# Patient Record
Sex: Female | Born: 1975
Health system: Southern US, Community
[De-identification: ages and names within clinical notes are randomized; demographics above are authoritative.]

## PROBLEM LIST (undated history)

## (undated) ENCOUNTER — Emergency Department (HOSPITAL_BASED_OUTPATIENT_CLINIC_OR_DEPARTMENT_OTHER): Admission: EM | Discharge status: Absent without leave | Payer: Medicaid Other

## (undated) ENCOUNTER — Emergency Department (HOSPITAL_BASED_OUTPATIENT_CLINIC_OR_DEPARTMENT_OTHER): Payer: Medicaid Other | Source: Home / Self Care

## (undated) DIAGNOSIS — L732 Hidradenitis suppurativa: Secondary | ICD-10-CM

## (undated) HISTORY — PX: OTHER SURGICAL HISTORY: SHX169

---

## 2009-10-13 ENCOUNTER — Emergency Department (HOSPITAL_COMMUNITY): Admission: EM | Admit: 2009-10-13 | Discharge: 2009-10-13 | Payer: Self-pay | Admitting: Family Medicine

## 2011-02-20 ENCOUNTER — Emergency Department (HOSPITAL_BASED_OUTPATIENT_CLINIC_OR_DEPARTMENT_OTHER)
Admission: EM | Admit: 2011-02-20 | Discharge: 2011-02-20 | Disposition: A | Discharge status: Home / Self Care | Payer: Medicaid Other | Attending: Emergency Medicine | Admitting: Emergency Medicine

## 2011-02-20 DIAGNOSIS — N760 Acute vaginitis: Secondary | ICD-10-CM | POA: Insufficient documentation

## 2011-02-20 DIAGNOSIS — B9689 Other specified bacterial agents as the cause of diseases classified elsewhere: Secondary | ICD-10-CM | POA: Insufficient documentation

## 2011-02-20 DIAGNOSIS — R51 Headache: Secondary | ICD-10-CM | POA: Insufficient documentation

## 2011-02-20 DIAGNOSIS — H9209 Otalgia, unspecified ear: Secondary | ICD-10-CM | POA: Insufficient documentation

## 2011-02-20 DIAGNOSIS — A499 Bacterial infection, unspecified: Secondary | ICD-10-CM | POA: Insufficient documentation

## 2011-02-20 LAB — URINALYSIS, ROUTINE W REFLEX MICROSCOPIC
Glucose, UA: NEGATIVE mg/dL
Ketones, ur: 15 mg/dL — AB
Leukocytes, UA: NEGATIVE
Nitrite: NEGATIVE
Protein, ur: NEGATIVE mg/dL
Specific Gravity, Urine: 1.027 (ref 1.005–1.030)
Urobilinogen, UA: 1 mg/dL (ref 0.0–1.0)
pH: 6 (ref 5.0–8.0)

## 2011-02-20 LAB — URINE MICROSCOPIC-ADD ON

## 2011-02-20 LAB — PREGNANCY, URINE: Preg Test, Ur: NEGATIVE

## 2011-02-20 LAB — WET PREP, GENITAL
Trich, Wet Prep: NONE SEEN
Yeast Wet Prep HPF POC: NONE SEEN

## 2011-02-21 LAB — GC/CHLAMYDIA PROBE AMP, GENITAL
Chlamydia, DNA Probe: NEGATIVE
GC Probe Amp, Genital: NEGATIVE

## 2011-03-26 ENCOUNTER — Emergency Department (INDEPENDENT_AMBULATORY_CARE_PROVIDER_SITE_OTHER): Payer: Medicaid Other

## 2011-03-26 ENCOUNTER — Emergency Department (HOSPITAL_BASED_OUTPATIENT_CLINIC_OR_DEPARTMENT_OTHER)
Admission: EM | Admit: 2011-03-26 | Discharge: 2011-03-26 | Disposition: A | Discharge status: Home / Self Care | Payer: Medicaid Other | Attending: Emergency Medicine | Admitting: Emergency Medicine

## 2011-03-26 DIAGNOSIS — M25579 Pain in unspecified ankle and joints of unspecified foot: Secondary | ICD-10-CM | POA: Insufficient documentation

## 2011-03-26 DIAGNOSIS — M7989 Other specified soft tissue disorders: Secondary | ICD-10-CM

## 2011-06-13 ENCOUNTER — Encounter: Payer: Self-pay | Admitting: Emergency Medicine

## 2011-06-13 ENCOUNTER — Emergency Department (HOSPITAL_BASED_OUTPATIENT_CLINIC_OR_DEPARTMENT_OTHER)
Admission: EM | Admit: 2011-06-13 | Discharge: 2011-06-13 | Disposition: A | Discharge status: Home / Self Care | Payer: Medicaid Other | Attending: Emergency Medicine | Admitting: Emergency Medicine

## 2011-06-13 DIAGNOSIS — B9689 Other specified bacterial agents as the cause of diseases classified elsewhere: Secondary | ICD-10-CM | POA: Insufficient documentation

## 2011-06-13 DIAGNOSIS — N739 Female pelvic inflammatory disease, unspecified: Secondary | ICD-10-CM | POA: Insufficient documentation

## 2011-06-13 DIAGNOSIS — N760 Acute vaginitis: Secondary | ICD-10-CM | POA: Insufficient documentation

## 2011-06-13 DIAGNOSIS — A499 Bacterial infection, unspecified: Secondary | ICD-10-CM | POA: Insufficient documentation

## 2011-06-13 DIAGNOSIS — R300 Dysuria: Secondary | ICD-10-CM | POA: Insufficient documentation

## 2011-06-13 HISTORY — DX: Hidradenitis suppurativa: L73.2

## 2011-06-13 LAB — WET PREP, GENITAL
Trich, Wet Prep: NONE SEEN
Yeast Wet Prep HPF POC: NONE SEEN

## 2011-06-13 LAB — URINALYSIS, ROUTINE W REFLEX MICROSCOPIC
Bilirubin Urine: NEGATIVE
Glucose, UA: NEGATIVE mg/dL
Hgb urine dipstick: NEGATIVE
Ketones, ur: NEGATIVE mg/dL
Leukocytes, UA: NEGATIVE
Nitrite: NEGATIVE
Protein, ur: NEGATIVE mg/dL
Specific Gravity, Urine: 1.027 (ref 1.005–1.030)
Urobilinogen, UA: 1 mg/dL (ref 0.0–1.0)
pH: 6 (ref 5.0–8.0)

## 2011-06-13 LAB — PREGNANCY, URINE: Preg Test, Ur: NEGATIVE

## 2011-06-13 MED ORDER — METRONIDAZOLE 0.75 % VA GEL: 1.0000 | Freq: Two times a day (BID) | VAGINAL | Status: AC

## 2011-06-13 MED ORDER — CEFTRIAXONE SODIUM 250 MG IJ SOLR
250.0000 mg | Freq: Once | INTRAMUSCULAR | Status: AC
  Administered 2011-06-13: 250 mg via INTRAMUSCULAR

## 2011-06-13 MED ORDER — AZITHROMYCIN 1 G PO PACK
1.0000 g | PACK | Freq: Once | ORAL | Status: AC
  Administered 2011-06-13: 1 g via ORAL

## 2011-06-13 NOTE — ED Notes (Signed)
Care plan reviewed with pt.

## 2011-06-13 NOTE — ED Provider Notes (Signed)
History     Chief Complaint  Patient presents with  . Dysuria   HPI Comments: Pt had dysuria about 3 weeks ago.  Then has recently developed a discharge.  She had been treated for bacterial vaginosis a month and one-half ago with metronidazole.  Last her back was hurting.  There has been no fever.  Patient is a 35 y.o. female presenting with dysuria. The history is provided by the patient. No language interpreter was used.  Dysuria  This is a new problem. The current episode started more than 1 week ago. The problem occurs intermittently. The problem has not changed since onset.The quality of the pain is described as burning. The pain is moderate. There has been no fever. Associated symptoms include discharge.    Past Medical History  Diagnosis Date  . Hidradenitis suppurativa     Past Surgical History  Procedure Date  . Cellulitis     cellulitis debridement    History reviewed. No pertinent family history.  History  Substance Use Topics  . Smoking status: Current Some Day Smoker    Types: Cigarettes  . Smokeless tobacco: Not on file  . Alcohol Use: Yes     occasional    OB History    Grav Para Term Preterm Abortions TAB SAB Ect Mult Living                  Review of Systems  Constitutional: Negative.  Negative for fever.  HENT: Negative.   Eyes: Negative.   Respiratory: Negative.   Cardiovascular: Negative.   Gastrointestinal: Negative.   Genitourinary: Positive for dysuria and vaginal discharge.  Musculoskeletal: Negative.   Psychiatric/Behavioral: Negative.     Physical Exam  BP 109/77  Pulse 74  Temp(Src) 98.4 F (36.9 C) (Oral)  Resp 18  Ht 5\' 10"  (1.778 m)  Wt 162 lb (73.483 kg)  BMI 23.24 kg/m2  SpO2 100%  LMP 05/13/2011  Physical Exam  Constitutional: She is oriented to person, place, and time. She appears well-developed and well-nourished. No distress.  HENT:  Head: Atraumatic.  Neck: Normal range of motion. Neck supple.  Cardiovascular:  Normal rate, regular rhythm and normal heart sounds.   Pulmonary/Chest: Effort normal and breath sounds normal.  Abdominal: Soft. There is Tenderness: Mild suprapubic tenderness..  Musculoskeletal: Normal range of motion.  Neurological: She is alert and oriented to person, place, and time.  Skin: Skin is warm and dry.  Psychiatric: She has a normal mood and affect.    ED Course  Procedures  MDM   UA is negative.  UPG negative.  Wet prep shows Clue cells.  Rx with metronidazole for bacterial vaginosis, and with Rocephin and Zithromax for possible pelvic inflammatory disease.        Carleene Cooper III, MD 06/13/11 2045

## 2011-06-13 NOTE — ED Notes (Signed)
Pt having dysuria x 3 weeks.  Pt also having vaginal discharge, thick, white.  Pt used monistat x 2 days due to burning and itching.  Pt relates she has been having some lower back pain as well.

## 2011-06-15 LAB — GC/CHLAMYDIA PROBE AMP, GENITAL
Chlamydia, DNA Probe: NEGATIVE
GC Probe Amp, Genital: NEGATIVE

## 2011-08-11 ENCOUNTER — Other Ambulatory Visit (HOSPITAL_COMMUNITY): Payer: Self-pay | Admitting: Gastroenterology

## 2011-08-11 DIAGNOSIS — R112 Nausea with vomiting, unspecified: Secondary | ICD-10-CM

## 2011-09-01 ENCOUNTER — Encounter (HOSPITAL_COMMUNITY)
Admission: RE | Admit: 2011-09-01 | Discharge: 2011-09-01 | Disposition: A | Discharge status: Home / Self Care | Payer: Medicaid Other | Source: Ambulatory Visit | Attending: Gastroenterology | Admitting: Gastroenterology

## 2011-09-01 DIAGNOSIS — R112 Nausea with vomiting, unspecified: Secondary | ICD-10-CM | POA: Insufficient documentation

## 2011-09-01 MED ORDER — TECHNETIUM TC 99M MEBROFENIN IV KIT
5.5000 | PACK | Freq: Once | INTRAVENOUS | Status: AC | PRN
  Administered 2011-09-01: 6 via INTRAVENOUS

## 2012-10-23 ENCOUNTER — Encounter (HOSPITAL_BASED_OUTPATIENT_CLINIC_OR_DEPARTMENT_OTHER): Payer: Self-pay

## 2012-10-23 ENCOUNTER — Emergency Department (HOSPITAL_BASED_OUTPATIENT_CLINIC_OR_DEPARTMENT_OTHER)
Admission: EM | Admit: 2012-10-23 | Discharge: 2012-10-23 | Disposition: A | Discharge status: Home / Self Care | Payer: Medicaid Other | Attending: Emergency Medicine | Admitting: Emergency Medicine

## 2012-10-23 DIAGNOSIS — L239 Allergic contact dermatitis, unspecified cause: Secondary | ICD-10-CM

## 2012-10-23 DIAGNOSIS — Z872 Personal history of diseases of the skin and subcutaneous tissue: Secondary | ICD-10-CM | POA: Insufficient documentation

## 2012-10-23 DIAGNOSIS — L270 Generalized skin eruption due to drugs and medicaments taken internally: Secondary | ICD-10-CM | POA: Insufficient documentation

## 2012-10-23 DIAGNOSIS — F17200 Nicotine dependence, unspecified, uncomplicated: Secondary | ICD-10-CM | POA: Insufficient documentation

## 2012-10-23 MED ORDER — PREDNISONE 50 MG PO TABS
60.0000 mg | ORAL_TABLET | Freq: Once | ORAL | Status: AC
  Administered 2012-10-23: 60 mg via ORAL
  Filled 2012-10-23: qty 1

## 2012-10-23 MED ORDER — PREDNISONE 20 MG PO TABS: ORAL_TABLET | ORAL | Status: DC

## 2012-10-23 MED ORDER — TRIAMCINOLONE ACETONIDE 0.1 % EX CREA: TOPICAL_CREAM | Freq: Two times a day (BID) | CUTANEOUS | Status: DC

## 2012-10-23 MED ORDER — HYDROXYZINE PAMOATE 100 MG PO CAPS: 100.0000 mg | ORAL_CAPSULE | Freq: Three times a day (TID) | ORAL | Status: DC | PRN

## 2012-10-23 NOTE — ED Provider Notes (Signed)
Medical screening examination/treatment/procedure(s) were performed by non-physician practitioner and as supervising physician I was immediately available for consultation/collaboration.  Magali Bray, MD 10/23/12 1522 

## 2012-10-23 NOTE — ED Provider Notes (Signed)
History     CSN: [id number]  Arrival date & time 10/23/12  1118   First MD Initiated Contact with Patient 10/23/12 1155      Chief Complaint  Patient presents with  . Rash    (Consider location/radiation/quality/duration/timing/severity/associated sxs/prior treatment) HPI Comments: 36 year old female presents to the emergency department complaining of a rash beginning on the right side of her face on Thursday after taking hydrocodone Wednesday night. States she had an oral procedure performed on November 20, was given Tylenol No. 3 for pain which he ran out of, so she took one of her old hydrocodone this past Wednesday. She is not sure how old the hydrocodone was. States the rash started on her face, is very itchy and has spread to her stomach, arms and chest. Describes it as an "burning" feeling. Denies difficulty swallowing. She tried taking over-the-counter allergy pills along with Benadryl without relief. Denies use of new soaps, detergents, lotions. No new pets. No contacts with similar rash.  Patient is a 36 y.o. female presenting with rash. The history is provided by the patient.  Rash     Past Medical History  Diagnosis Date  . Hidradenitis suppurativa     Past Surgical History  Procedure Date  . Cellulitis     cellulitis debridement    No family history on file.  History  Substance Use Topics  . Smoking status: Current Some Day Smoker    Types: Cigarettes  . Smokeless tobacco: Not on file  . Alcohol Use: Yes     Comment: occasional    OB History    Grav Para Term Preterm Abortions TAB SAB Ect Mult Living                  Review of Systems  Constitutional: Negative for fever and chills.  HENT: Negative for trouble swallowing and neck stiffness.   Respiratory: Negative for wheezing.   Skin: Positive for rash.    Allergies  Clindamycin/lincomycin; Darvocet; and Percocet  Home Medications   Current Outpatient Rx  Name  Route  Sig  Dispense  Refill    . HYDROXYZINE PAMOATE 100 MG PO CAPS   Oral   Take 1 capsule (100 mg total) by mouth 3 (three) times daily as needed for itching.   30 capsule   0   . PREDNISONE 20 MG PO TABS      Take 3 tabs PO x 2 days followed by 2 tabs PO x 2 days followed by 1 tab PO x 2 days   12 tablet   0   . TRIAMCINOLONE ACETONIDE 0.1 % EX CREA   Topical   Apply topically 2 (two) times daily.   30 g   0     BP 119/64  Pulse 77  Temp 97.8 F (36.6 C) (Oral)  Resp 16  Ht 5\' 11"  (1.803 m)  Wt 160 lb (72.576 kg)  BMI 22.32 kg/m2  SpO2 100%  LMP 09/23/2012  Physical Exam  Nursing note and vitals reviewed. Constitutional: She is oriented to person, place, and time. She appears well-developed and well-nourished. No distress.  HENT:  Head: Normocephalic and atraumatic.  Mouth/Throat: Oropharynx is clear and moist. No oral lesions.  Eyes: Conjunctivae normal are normal.  Neck: Normal range of motion. Neck supple.  Cardiovascular: Normal rate, regular rhythm and normal heart sounds.   Pulmonary/Chest: Effort normal and breath sounds normal.  Musculoskeletal: Normal range of motion. She exhibits no edema.  Neurological: She is alert and  oriented to person, place, and time.  Skin: Rash noted. Rash is maculopapular (scattered on abdomen, chest, rigth side of face, mild on bilateral arms and back. spares mucosa, palms and soles.).  Psychiatric: She has a normal mood and affect. Her behavior is normal.    ED Course  Procedures (including critical care time)  Labs Reviewed - No data to display No results found.   1. Allergic dermatitis       MDM  36 y/o female with allergic dermatitis from hydrocodone. Rash on body only, spares all mucosa. Prescribed prednisone, vistaril, and triamcinolone cream. Aware to not apply triamcinolone on face. Return precautions discussed.        Trevor Mace, PA-C 10/23/12 1221

## 2012-10-23 NOTE — ED Notes (Signed)
Pt reports developed a generalized rash on Thursday after taking Hydrocodone. Rash is unrelieved after taking OTC medications.

## 2013-08-26 ENCOUNTER — Emergency Department (HOSPITAL_BASED_OUTPATIENT_CLINIC_OR_DEPARTMENT_OTHER)
Admission: EM | Admit: 2013-08-26 | Discharge: 2013-08-26 | Disposition: A | Discharge status: Home / Self Care | Payer: Medicaid Other | Attending: Emergency Medicine | Admitting: Emergency Medicine

## 2013-08-26 ENCOUNTER — Encounter (HOSPITAL_BASED_OUTPATIENT_CLINIC_OR_DEPARTMENT_OTHER): Payer: Self-pay | Admitting: Emergency Medicine

## 2013-08-26 ENCOUNTER — Emergency Department (HOSPITAL_BASED_OUTPATIENT_CLINIC_OR_DEPARTMENT_OTHER): Payer: Medicaid Other

## 2013-08-26 DIAGNOSIS — J209 Acute bronchitis, unspecified: Secondary | ICD-10-CM | POA: Insufficient documentation

## 2013-08-26 DIAGNOSIS — Z872 Personal history of diseases of the skin and subcutaneous tissue: Secondary | ICD-10-CM | POA: Insufficient documentation

## 2013-08-26 DIAGNOSIS — R630 Anorexia: Secondary | ICD-10-CM | POA: Insufficient documentation

## 2013-08-26 DIAGNOSIS — N898 Other specified noninflammatory disorders of vagina: Secondary | ICD-10-CM | POA: Insufficient documentation

## 2013-08-26 DIAGNOSIS — IMO0002 Reserved for concepts with insufficient information to code with codable children: Secondary | ICD-10-CM | POA: Insufficient documentation

## 2013-08-26 DIAGNOSIS — J40 Bronchitis, not specified as acute or chronic: Secondary | ICD-10-CM

## 2013-08-26 DIAGNOSIS — R509 Fever, unspecified: Secondary | ICD-10-CM | POA: Insufficient documentation

## 2013-08-26 DIAGNOSIS — Z87891 Personal history of nicotine dependence: Secondary | ICD-10-CM | POA: Insufficient documentation

## 2013-08-26 MED ORDER — AZITHROMYCIN 250 MG PO TABS: 250.0000 mg | ORAL_TABLET | Freq: Every day | ORAL | Status: DC

## 2013-08-26 MED ORDER — GUAIFENESIN 100 MG/5ML PO LIQD: 100.0000 mg | ORAL | Status: DC | PRN

## 2013-08-26 MED ORDER — ALBUTEROL SULFATE HFA 108 (90 BASE) MCG/ACT IN AERS
2.0000 | INHALATION_SPRAY | RESPIRATORY_TRACT | Status: DC | PRN
  Administered 2013-08-26: 2 via RESPIRATORY_TRACT
  Filled 2013-08-26: qty 6.7

## 2013-08-26 MED ORDER — FLUCONAZOLE 200 MG PO TABS: 200.0000 mg | ORAL_TABLET | Freq: Every day | ORAL | Status: AC

## 2013-08-26 NOTE — ED Provider Notes (Signed)
CSN: [id number]     Arrival date & time 08/26/13  1916 History   First MD Initiated Contact with Patient 08/26/13 1941     Chief Complaint  Patient presents with  . Chest Pain  . Cough   (Consider location/radiation/quality/duration/timing/severity/associated sxs/prior Treatment) Patient is a 37 y.o. female presenting with chest pain and cough. The history is provided by the patient.  Chest Pain Pain quality: burning and hot   Pain radiates to:  Does not radiate Pain radiates to the back: no   Pain severity:  Moderate Onset quality:  Gradual Duration:  2 days Timing:  Sporadic Progression:  Worsening Chronicity:  New Context: movement   Context comment:  Cough Relieved by:  None tried Worsened by:  Coughing and movement Ineffective treatments:  None tried Associated symptoms: anorexia, cough and fever   Associated symptoms: no abdominal pain, no back pain, no headache, no heartburn, no lower extremity edema, no nausea, no near-syncope, no shortness of breath, no syncope, not vomiting and no weakness   Cough Associated symptoms: chest pain, fever and wheezing   Associated symptoms: no headaches, no rash and no shortness of breath    Elaine Chase is a 37 y.o. female with cough cold and congestion. Last night she had fever. She has been coughing up thick green sputum.  Past Medical History  Diagnosis Date  . Hidradenitis suppurativa    Past Surgical History  Procedure Laterality Date  . Cellulitis      cellulitis debridement   No family history on file. History  Substance Use Topics  . Smoking status: Former Smoker    Types: Cigarettes  . Smokeless tobacco: Not on file  . Alcohol Use: Yes     Comment: occasional   OB History   Grav Para Term Preterm Abortions TAB SAB Ect Mult Living                 Review of Systems  Constitutional: Positive for fever.  Respiratory: Positive for cough and wheezing. Negative for shortness of breath.   Cardiovascular: Positive  for chest pain. Negative for syncope and near-syncope.  Gastrointestinal: Positive for anorexia. Negative for heartburn, nausea, vomiting and abdominal pain.  Genitourinary: Positive for vaginal bleeding. Negative for dysuria, urgency, frequency and vaginal discharge.  Musculoskeletal: Negative for back pain.  Skin: Negative for rash.  Allergic/Immunologic: Negative for immunocompromised state.  Neurological: Negative for weakness and headaches.  Psychiatric/Behavioral: The patient is not nervous/anxious.     Allergies  Clindamycin/lincomycin; Darvocet; and Percocet  Home Medications   Current Outpatient Rx  Name  Route  Sig  Dispense  Refill  . hydrOXYzine (VISTARIL) 100 MG capsule   Oral   Take 1 capsule (100 mg total) by mouth 3 (three) times daily as needed for itching.   30 capsule   0   . predniSONE (DELTASONE) 20 MG tablet      Take 3 tabs PO x 2 days followed by 2 tabs PO x 2 days followed by 1 tab PO x 2 days   12 tablet   0   . triamcinolone cream (KENALOG) 0.1 %   Topical   Apply topically 2 (two) times daily.   30 g   0    BP 108/48  Temp(Src) 98.7 F (37.1 C) (Oral)  SpO2 97% Physical Exam  Nursing note and vitals reviewed. Constitutional: She is oriented to person, place, and time. She appears well-developed and well-nourished. No distress.  HENT:  Head: Normocephalic and atraumatic.  Eyes: Conjunctivae and EOM are normal.  Neck: Neck supple.  Cardiovascular: Normal rate, regular rhythm and normal heart sounds.   Pulmonary/Chest: Effort normal. She has no decreased breath sounds. She has wheezes. She has rhonchi.  Abdominal: Soft. There is no tenderness.  Musculoskeletal: Normal range of motion.  Neurological: She is alert and oriented to person, place, and time. No cranial nerve deficit.  Skin: Skin is warm and dry.  Psychiatric: She has a normal mood and affect. Her behavior is normal.   Dg Chest 2 View  08/26/2013   CLINICAL DATA:  Fever and  cough, shortness of breath  EXAM: CHEST  2 VIEW  COMPARISON:  None.  FINDINGS: The heart size and mediastinal contours are within normal limits. Both lungs are clear. The visualized skeletal structures are unremarkable.  IMPRESSION: No active cardiopulmonary disease.   Electronically Signed   By: Christiana Pellant M.D.   On: 08/26/2013 21:27    ED Course  Procedures  MDM  37 y.o. female with productive cough, congestion x 2 days and fever last night. Will treat for bronchitis with Albuterol inhaler, cough medication and antibiotics. Inhaler and instructions on use given to patient prior to discharge. Patient stable for discharge home without any immediate complications.  Discussed with the patient clinical and x-ray findings and all questioned fully answered. Patient voices understanding.  She will return if any problems arise.    Medication List    TAKE these medications       azithromycin 250 MG tablet  Commonly known as:  ZITHROMAX  Take 1 tablet (250 mg total) by mouth daily. Take first 2 tablets together, then 1 every day until finished.     fluconazole 200 MG tablet  Commonly known as:  DIFLUCAN  Take 1 tablet (200 mg total) by mouth daily.     guaiFENesin 100 MG/5ML liquid  Commonly known as:  ROBITUSSIN  Take 5-10 mLs (100-200 mg total) by mouth every 4 (four) hours as needed for cough.      ASK your doctor about these medications       hydrOXYzine 100 MG capsule  Commonly known as:  VISTARIL  Take 1 capsule (100 mg total) by mouth 3 (three) times daily as needed for itching.     predniSONE 20 MG tablet  Commonly known as:  DELTASONE  Take 3 tabs PO x 2 days followed by 2 tabs PO x 2 days followed by 1 tab PO x 2 days     triamcinolone cream 0.1 %  Commonly known as:  KENALOG  Apply topically 2 (two) times daily.           [address] [place], [place] 08/27/13 [phone]

## 2013-08-26 NOTE — ED Notes (Signed)
Xray called about chest xray Elaine Chase is next to be seen.

## 2013-08-26 NOTE — ED Notes (Signed)
C/o chest heaviness, fever at night, coughing, wheezing, green thick sputum, throat feels hot

## 2013-08-26 NOTE — ED Notes (Signed)
Pt given RX for Diflucan d/t Hx yeast infection with antibiotic use NP notified and RX obtained and given

## 2013-08-26 NOTE — ED Notes (Signed)
Pt reports throat burning when she speaks and mild SOB w/ cough denies seasonal allergies reports cough x 2 days with green mucus product

## 2013-09-04 NOTE — ED Provider Notes (Signed)
Medical screening examination/treatment/procedure(s) were performed by non-physician practitioner and as supervising physician I was immediately available for consultation/collaboration.  Monic Engelmann, MD 09/04/13 1409 

## 2014-04-21 ENCOUNTER — Emergency Department (HOSPITAL_BASED_OUTPATIENT_CLINIC_OR_DEPARTMENT_OTHER)
Admission: EM | Admit: 2014-04-21 | Discharge: 2014-04-21 | Disposition: A | Discharge status: Home / Self Care | Payer: Medicaid Other | Attending: Emergency Medicine | Admitting: Emergency Medicine

## 2014-04-21 ENCOUNTER — Encounter (HOSPITAL_BASED_OUTPATIENT_CLINIC_OR_DEPARTMENT_OTHER): Payer: Self-pay | Admitting: Emergency Medicine

## 2014-04-21 DIAGNOSIS — Z87891 Personal history of nicotine dependence: Secondary | ICD-10-CM | POA: Insufficient documentation

## 2014-04-21 DIAGNOSIS — E860 Dehydration: Secondary | ICD-10-CM | POA: Insufficient documentation

## 2014-04-21 DIAGNOSIS — Z792 Long term (current) use of antibiotics: Secondary | ICD-10-CM | POA: Insufficient documentation

## 2014-04-21 DIAGNOSIS — Z3202 Encounter for pregnancy test, result negative: Secondary | ICD-10-CM | POA: Insufficient documentation

## 2014-04-21 DIAGNOSIS — IMO0002 Reserved for concepts with insufficient information to code with codable children: Secondary | ICD-10-CM | POA: Insufficient documentation

## 2014-04-21 DIAGNOSIS — R5383 Other fatigue: Secondary | ICD-10-CM | POA: Insufficient documentation

## 2014-04-21 DIAGNOSIS — Z872 Personal history of diseases of the skin and subcutaneous tissue: Secondary | ICD-10-CM | POA: Insufficient documentation

## 2014-04-21 DIAGNOSIS — R5381 Other malaise: Secondary | ICD-10-CM | POA: Insufficient documentation

## 2014-04-21 DIAGNOSIS — G43909 Migraine, unspecified, not intractable, without status migrainosus: Secondary | ICD-10-CM | POA: Insufficient documentation

## 2014-04-21 LAB — COMPREHENSIVE METABOLIC PANEL
ALT: 13 U/L (ref 0–35)
AST: 16 U/L (ref 0–37)
Albumin: 3.8 g/dL (ref 3.5–5.2)
Alkaline Phosphatase: 64 U/L (ref 39–117)
BUN: 11 mg/dL (ref 6–23)
CO2: 25 mEq/L (ref 19–32)
Calcium: 9.1 mg/dL (ref 8.4–10.5)
Chloride: 105 mEq/L (ref 96–112)
Creatinine, Ser: 0.9 mg/dL (ref 0.50–1.10)
GFR calc Af Amer: >90 mL/min (ref >90)
GFR calc non Af Amer: 81 mL/min — ABNORMAL LOW (ref >90)
Glucose, Bld: 89 mg/dL (ref 70–99)
Potassium: 4.2 mEq/L (ref 3.7–5.3)
Sodium: 141 mEq/L (ref 137–147)
Total Bilirubin: 0.2 mg/dL — ABNORMAL LOW (ref 0.3–1.2)
Total Protein: 7.1 g/dL (ref 6.0–8.3)

## 2014-04-21 LAB — URINALYSIS, ROUTINE W REFLEX MICROSCOPIC
Bilirubin Urine: NEGATIVE
Glucose, UA: NEGATIVE mg/dL
Hgb urine dipstick: NEGATIVE
Ketones, ur: NEGATIVE mg/dL
Leukocytes, UA: NEGATIVE
Nitrite: NEGATIVE
Protein, ur: NEGATIVE mg/dL
Specific Gravity, Urine: 1.028 (ref 1.005–1.030)
Urobilinogen, UA: 1 mg/dL (ref 0.0–1.0)
pH: 6 (ref 5.0–8.0)

## 2014-04-21 LAB — CBC WITH DIFFERENTIAL/PLATELET
Basophils Absolute: 0 10*3/uL (ref 0.0–0.1)
Basophils Relative: 0 % (ref 0–1)
Eosinophils Absolute: 0.2 10*3/uL (ref 0.0–0.7)
Eosinophils Relative: 3 % (ref 0–5)
HCT: 41.1 % (ref 36.0–46.0)
Hemoglobin: 14.2 g/dL (ref 12.0–15.0)
Lymphocytes Relative: 50 % — ABNORMAL HIGH (ref 12–46)
Lymphs Abs: 3.4 10*3/uL (ref 0.7–4.0)
MCH: 31.1 pg (ref 26.0–34.0)
MCHC: 34.5 g/dL (ref 30.0–36.0)
MCV: 90.1 fL (ref 78.0–100.0)
Monocytes Absolute: 0.5 10*3/uL (ref 0.1–1.0)
Monocytes Relative: 8 % (ref 3–12)
Neutro Abs: 2.6 10*3/uL (ref 1.7–7.7)
Neutrophils Relative %: 39 % — ABNORMAL LOW (ref 43–77)
Platelets: 232 10*3/uL (ref 150–400)
RBC: 4.56 MIL/uL (ref 3.87–5.11)
RDW: 12.8 % (ref 11.5–15.5)
WBC: 6.8 10*3/uL (ref 4.0–10.5)

## 2014-04-21 LAB — PREGNANCY, URINE: Preg Test, Ur: NEGATIVE

## 2014-04-21 MED ORDER — DIPHENHYDRAMINE HCL 50 MG/ML IJ SOLN
25.0000 mg | Freq: Once | INTRAMUSCULAR | Status: AC
  Administered 2014-04-21: 25 mg via INTRAVENOUS
  Filled 2014-04-21: qty 1

## 2014-04-21 MED ORDER — SODIUM CHLORIDE 0.9 % IV BOLUS (SEPSIS)
1000.0000 mL | Freq: Once | INTRAVENOUS | Status: AC
  Administered 2014-04-21: 1000 mL via INTRAVENOUS

## 2014-04-21 MED ORDER — METOCLOPRAMIDE HCL 5 MG/ML IJ SOLN
10.0000 mg | Freq: Once | INTRAMUSCULAR | Status: AC
  Administered 2014-04-21: 10 mg via INTRAVENOUS
  Filled 2014-04-21: qty 2

## 2014-04-21 NOTE — ED Provider Notes (Addendum)
CSN: [id number]     Arrival date & time 04/21/14  [id number] History   First MD Initiated Contact with Patient 04/21/14 [phone]     Chief Complaint  Patient presents with  . Headache     (Consider location/radiation/quality/duration/timing/severity/associated sxs/prior Treatment) Patient is a 38 y.o. female presenting with headaches. The history is provided by the patient.  Headache Pain location:  L temporal Quality:  Dull Radiates to:  Does not radiate Severity currently:  7/10 Severity at highest:  7/10 Onset quality:  Gradual Duration:  3 days Timing:  Constant Progression:  Unchanged Chronicity:  New Similar to prior headaches: no   Context: bright light and loud noise   Context comment:  States about 1 week ago had unknown allergic reaction and has been on prednisone since.  at that time her bp was also low.  then headache started 3 days ago.  has 1 day of prednisone left Relieved by:  Nothing Worsened by:  Nothing tried Associated symptoms: fatigue, nausea, photophobia, vomiting and weakness   Associated symptoms: no abdominal pain, no back pain, no blurred vision, no cough, no diarrhea, no fever, no focal weakness, no numbness and no paresthesias     Past Medical History  Diagnosis Date  . Hidradenitis suppurativa    Past Surgical History  Procedure Laterality Date  . Cellulitis      cellulitis debridement   No family history on file. History  Substance Use Topics  . Smoking status: Former Smoker    Types: Cigarettes  . Smokeless tobacco: Not on file  . Alcohol Use: Yes     Comment: occasional   OB History   Grav Para Term Preterm Abortions TAB SAB Ect Mult Living                 Review of Systems  Constitutional: Positive for fatigue. Negative for fever.  Eyes: Positive for photophobia. Negative for blurred vision.  Respiratory: Negative for cough.   Gastrointestinal: Positive for nausea and vomiting. Negative for abdominal pain and diarrhea.   Musculoskeletal: Negative for back pain.  Skin:       Has a hx of suppurative hidradenitis and had a boil develop early this week and drain spontaneously but has not resolved and denies any groin pain, swelling or redness now.  Neurological: Positive for headaches. Negative for focal weakness, numbness and paresthesias.  All other systems reviewed and are negative.     Allergies  Clindamycin/lincomycin; Darvocet; and Percocet  Home Medications   Prior to Admission medications   Medication Sig Start Date End Date Taking? Authorizing Provider  azithromycin (ZITHROMAX) 250 MG tablet Take 1 tablet (250 mg total) by mouth daily. Take first 2 tablets together, then 1 every day until finished. 08/26/13   Hope Orlene OchM Neese, NP  guaiFENesin (ROBITUSSIN) 100 MG/5ML liquid Take 5-10 mLs (100-200 mg total) by mouth every 4 (four) hours as needed for cough. 08/26/13   Hope Orlene OchM Neese, NP  hydrOXYzine (VISTARIL) 100 MG capsule Take 1 capsule (100 mg total) by mouth 3 (three) times daily as needed for itching. 10/23/12   Trevor Maceobyn M Albert, PA-C  predniSONE (DELTASONE) 20 MG tablet Take 3 tabs PO x 2 days followed by 2 tabs PO x 2 days followed by 1 tab PO x 2 days 10/23/12   Trevor Maceobyn M Albert, PA-C  triamcinolone cream (KENALOG) 0.1 % Apply topically 2 (two) times daily. 10/23/12   Trevor Maceobyn M Albert, PA-C   BP 106/73  Pulse 69  Temp(Src) 98.9  F (37.2 C) (Oral)  Resp 16  Ht 5\' 11"  (1.803 m)  Wt 146 lb (66.225 kg)  BMI 20.37 kg/m2  SpO2 100% Physical Exam  Nursing note and vitals reviewed. Constitutional: She is oriented to person, place, and time. She appears well-developed and well-nourished. No distress.  HENT:  Head: Normocephalic and atraumatic.  Right Ear: Tympanic membrane normal.  Left Ear: Tympanic membrane normal.  Mouth/Throat: Oropharynx is clear and moist.  Eyes: Conjunctivae and EOM are normal. Pupils are equal, round, and reactive to light. Right eye exhibits no discharge. Left eye exhibits no  discharge.  photophobia  Neck: Normal range of motion. Neck supple. No spinous process tenderness present. No rigidity. No Brudzinski's sign and no Kernig's sign noted.  Cardiovascular: Normal rate, normal heart sounds and intact distal pulses.   No murmur heard. Pulmonary/Chest: Effort normal and breath sounds normal. No respiratory distress. She has no wheezes. She has no rales.  Abdominal: Soft. She exhibits no distension. There is no tenderness.  Musculoskeletal: Normal range of motion. She exhibits no edema and no tenderness.  Lymphadenopathy:    She has no cervical adenopathy.  Neurological: She is alert and oriented to person, place, and time. She has normal strength. No cranial nerve deficit or sensory deficit. Coordination and gait normal. GCS eye subscore is 4. GCS verbal subscore is 5. GCS motor subscore is 6.  Skin: Skin is warm and dry.  Psychiatric: She has a normal mood and affect. Her behavior is normal.    ED Course  Procedures (including critical care time) Labs Review Labs Reviewed  CBC WITH DIFFERENTIAL - Abnormal; Notable for the following:    Neutrophils Relative % 39 (*)    Lymphocytes Relative 50 (*)    All other components within normal limits  COMPREHENSIVE METABOLIC PANEL - Abnormal; Notable for the following:    Total Bilirubin 0.2 (*)    GFR calc non Af Amer 81 (*)    All other components within normal limits  URINALYSIS, ROUTINE W REFLEX MICROSCOPIC  PREGNANCY, URINE    Imaging Review No results found.   EKG Interpretation None      MDM   Final diagnoses:  Migraine  Dehydration   Pt with sx that sound typical for migraine HA without sx suggestive of SAH(sudden onset, worst of life, or deficits), infection, or cavernous vein thrombosis.  Normal neuro exam and vital signs.  Pt recently has been on prednisone for the last week for allergic reaction to unknown substance.  She denies chronic headaches or tick exposure.  No fever and no meningeal  signs on exam.  She also feels like urine output has decreased despite drinking fluid.  Denies dysuria.   Will give HA cocktail and on re-eval.  UA and UPT wnl.  CBC, CMP wnl.  11:29 AM Pt feeling better.  Will d/c home.  Gwyneth Sprout, MD 04/21/14 [id number]  Gwyneth Sprout, MD 04/21/14 1130

## 2014-04-21 NOTE — ED Notes (Addendum)
Patient c/o HA for the past three days, patient states that she was told by primary MD that she was dehydrated and need to increase her fluid intake, no meds taken today. Patient states that she is currently taking prednisone for an allergic reaction

## 2014-04-21 NOTE — Discharge Instructions (Signed)
Dehydration, Adult  Dehydration means your body does not have as much fluid as it needs. Your kidneys, brain, and heart will not work properly without the right amount of fluids and salt.   HOME CARE   Ask your doctor how to replace body fluid losses (rehydrate).   Drink enough fluids to keep your pee (urine) clear or pale yellow.   Drink small amounts of fluids often if you feel sick to your stomach (nauseous) or throw up (vomit).   Eat like you normally do.   Avoid:   Foods or drinks high in sugar.   Bubbly (carbonated) drinks.   Juice.   Very hot or cold fluids.   Drinks with caffeine.   Fatty, greasy foods.   Alcohol.   Tobacco.   Eating too much.   Gelatin desserts.   Wash your hands to avoid spreading germs (bacteria, viruses).   Only take medicine as told by your doctor.   Keep all doctor visits as told.  GET HELP RIGHT AWAY IF:    You cannot drink something without throwing up.   You get worse even with treatment.   Your vomit has blood in it or looks greenish.   Your poop (stool) has blood in it or looks black and tarry.   You have not peed in 6 to 8 hours.   You pee a small amount of very dark pee.   You have a fever.   You pass out (faint).   You have belly (abdominal) pain that gets worse or stays in one spot (localizes).   You have a rash, stiff neck, or bad headache.   You get easily annoyed, sleepy, or are hard to wake up.   You feel weak, dizzy, or very thirsty.  MAKE SURE YOU:    Understand these instructions.   Will watch your condition.   Will get help right away if you are not doing well or get worse.  Document Released: 09/05/2009 Document Revised: 02/01/2012 Document Reviewed: 06/29/2011  [employer] Patient Information 2014 [employer]

## 2017-05-11 ENCOUNTER — Encounter (HOSPITAL_BASED_OUTPATIENT_CLINIC_OR_DEPARTMENT_OTHER): Payer: Self-pay | Admitting: *Deleted

## 2017-05-11 ENCOUNTER — Emergency Department (HOSPITAL_BASED_OUTPATIENT_CLINIC_OR_DEPARTMENT_OTHER)
Admission: EM | Admit: 2017-05-11 | Discharge: 2017-05-11 | Disposition: A | Discharge status: Home / Self Care | Payer: Medicaid Other | Attending: Emergency Medicine | Admitting: Emergency Medicine

## 2017-05-11 DIAGNOSIS — Z87891 Personal history of nicotine dependence: Secondary | ICD-10-CM | POA: Diagnosis not present

## 2017-05-11 DIAGNOSIS — R05 Cough: Secondary | ICD-10-CM | POA: Diagnosis not present

## 2017-05-11 DIAGNOSIS — J111 Influenza due to unidentified influenza virus with other respiratory manifestations: Secondary | ICD-10-CM | POA: Diagnosis not present

## 2017-05-11 DIAGNOSIS — R69 Illness, unspecified: Secondary | ICD-10-CM

## 2017-05-11 DIAGNOSIS — J029 Acute pharyngitis, unspecified: Secondary | ICD-10-CM | POA: Diagnosis present

## 2017-05-11 LAB — RAPID STREP SCREEN (MED CTR MEBANE ONLY): Streptococcus, Group A Screen (Direct): NEGATIVE

## 2017-05-11 MED ORDER — IBUPROFEN 400 MG PO TABS
400.0000 mg | ORAL_TABLET | Freq: Once | ORAL | Status: AC
  Administered 2017-05-11: 400 mg via ORAL
  Filled 2017-05-11: qty 1

## 2017-05-11 MED ORDER — DOXYCYCLINE HYCLATE 100 MG PO CAPS: 100.0000 mg | ORAL_CAPSULE | Freq: Two times a day (BID) | ORAL | 0 refills | Status: DC

## 2017-05-11 NOTE — ED Triage Notes (Signed)
Cough with chest soreness, body aches, sore throat x 2 days.

## 2017-05-11 NOTE — ED Provider Notes (Signed)
[facility] Provider Note   CSN: [id number] Arrival date & time: 05/11/17  1307     History   Chief Complaint Chief Complaint  Patient presents with  . Sore Throat  . Cough    HPI Elaine Chase is a 41 y.o. female.  41 yo F with a chief complaints of cough congestion fevers chills myalgias. Going on for the past 3 or 4 days. Denies sick contacts. Having diffuse muscle aches. Did have the erythematous rash to the inside of the left leg that was noted to have a central clearing with spreading redness. Denies tick exposure.   The history is provided by the patient.  Sore Throat  Pertinent negatives include no chest pain, no headaches and no shortness of breath.  Cough  Associated symptoms include chills, sore throat and myalgias. Pertinent negatives include no chest pain, no headaches, no rhinorrhea, no shortness of breath, no wheezing and no eye redness.  Illness  This is a new problem. The current episode started more than 2 days ago. The problem occurs constantly. The problem has not changed since onset.Pertinent negatives include no chest pain, no headaches and no shortness of breath. Nothing aggravates the symptoms. Nothing relieves the symptoms. She has tried nothing for the symptoms.    Past Medical History:  Diagnosis Date  . Hidradenitis suppurativa     There are no active problems to display for this patient.   Past Surgical History:  Procedure Laterality Date  . cellulitis     cellulitis debridement    OB History    No data available       Home Medications    Prior to Admission medications   Medication Sig Start Date End Date Taking? Authorizing Provider  azithromycin (ZITHROMAX) 250 MG tablet Take 1 tablet (250 mg total) by mouth daily. Take first 2 tablets together, then 1 every day until finished. 08/26/13   Janne Napoleon, NP  doxycycline (VIBRAMYCIN) 100 MG capsule Take 1 capsule (100 mg total) by mouth 2 (two) times daily. One po bid  x 7 days 05/11/17   Melene Plan, DO  guaiFENesin (ROBITUSSIN) 100 MG/5ML liquid Take 5-10 mLs (100-200 mg total) by mouth every 4 (four) hours as needed for cough. 08/26/13   Janne Napoleon, NP  hydrOXYzine (VISTARIL) 100 MG capsule Take 1 capsule (100 mg total) by mouth 3 (three) times daily as needed for itching. 10/23/12   Hess, Nada Boozer, PA-C  predniSONE (DELTASONE) 20 MG tablet Take 3 tabs PO x 2 days followed by 2 tabs PO x 2 days followed by 1 tab PO x 2 days 10/23/12   Paulina Fusi, Nada Boozer, PA-C  triamcinolone cream (KENALOG) 0.1 % Apply topically 2 (two) times daily. 10/23/12   Hess, Nada Boozer, PA-C    Family History No family history on file.  Social History Social History  Substance Use Topics  . Smoking status: Former Smoker    Types: Cigarettes  . Smokeless tobacco: Never Used  . Alcohol use Yes     Comment: occasional     Allergies   Clindamycin/lincomycin; Darvocet [propoxyphene n-acetaminophen]; and Percocet [oxycodone-acetaminophen]   Review of Systems Review of Systems  Constitutional: Positive for chills and fever.  HENT: Positive for congestion and sore throat. Negative for rhinorrhea.   Eyes: Negative for redness and visual disturbance.  Respiratory: Positive for cough. Negative for shortness of breath and wheezing.   Cardiovascular: Negative for chest pain and palpitations.  Gastrointestinal: Negative for nausea and vomiting.  Genitourinary: Negative for dysuria and urgency.  Musculoskeletal: Positive for myalgias. Negative for arthralgias.  Skin: Negative for pallor and wound.  Neurological: Negative for dizziness and headaches.     Physical Exam Updated Vital Signs BP 107/75   Pulse 84   Temp 99.3 F (37.4 C) (Oral)   Resp 18   Ht 5\' 11"  (1.803 m)   Wt 59 kg (130 lb)   LMP 04/15/2017   SpO2 99%   BMI 18.13 kg/m   Physical Exam  Constitutional: She is oriented to person, place, and time. She appears well-developed and well-nourished. No distress.  HENT:    Head: Normocephalic and atraumatic.  Swollen turbinates, posterior nasal drip, no noted sinus ttp, tm normal bilaterally.    Eyes: EOM are normal. Pupils are equal, round, and reactive to light.  Neck: Normal range of motion. Neck supple.  Cardiovascular: Normal rate and regular rhythm.  Exam reveals no gallop and no friction rub.   No murmur heard. Pulmonary/Chest: Effort normal. She has no wheezes. She has no rales.  Abdominal: Soft. She exhibits no distension. There is no tenderness.  Musculoskeletal: She exhibits no edema or tenderness.  Neurological: She is alert and oriented to person, place, and time.  Skin: Skin is warm and dry. She is not diaphoretic.  Small lesion to inside of left thigh   Psychiatric: She has a normal mood and affect. Her behavior is normal.  Nursing note and vitals reviewed.    ED Treatments / Results  Labs (all labs ordered are listed, but only abnormal results are displayed) Labs Reviewed  RAPID STREP SCREEN (NOT AT [facility])  CULTURE, GROUP A STREP [facility])    EKG  EKG Interpretation None       Radiology No results found.  Procedures Procedures (including critical care time)  Medications Ordered in ED Medications  ibuprofen (ADVIL,MOTRIN) tablet 400 mg (400 mg Oral Given by Other 05/11/17 1505)     Initial Impression / Assessment and Plan / ED Course  I have reviewed the triage vital signs and the nursing notes.  Pertinent labs & imaging results that were available during my care of the patient were reviewed by me and considered in my medical decision making (see chart for details).     41 yo F With a chief complaint of cough congestion fevers chills myalgias. This likely influenza-like illness based on history however the patient is also noted to have some sort of circular erythema to the left side of her leg. As this is tick season will treat with doxycycline. Have her follow-up with her family doctor.  3:20 PM:  I have discussed the  diagnosis/risks/treatment options with the patient and family and believe the pt to be eligible for discharge home to follow-up with PCP. We also discussed returning to the ED immediately if new or worsening sx occur. We discussed the sx which are most concerning (e.g., sudden worsening pain, fever, inability to tolerate by mouth) that necessitate immediate return. Medications administered to the patient during their visit and any new prescriptions provided to the patient are listed below.  Medications given during this visit Medications  ibuprofen (ADVIL,MOTRIN) tablet 400 mg (400 mg Oral Given by Other 05/11/17 1505)     The patient appears reasonably screen and/or stabilized for discharge and I doubt any other medical condition or other [facility] requiring further screening, evaluation, or treatment in the ED at this time prior to discharge.    Final Clinical Impressions(s) / ED Diagnoses  Final diagnoses:  Influenza-like illness    New Prescriptions Discharge Medication List as of 05/11/2017  2:48 PM    START taking these medications   Details  doxycycline (VIBRAMYCIN) 100 MG capsule Take 1 capsule (100 mg total) by mouth 2 (two) times daily. One po bid x 7 days, Starting Tue 05/11/2017, Print         [place], [place], DO 05/11/17 1520

## 2017-05-11 NOTE — Discharge Instructions (Signed)
Take tylenol 2 pills 4 times a day and motrin 4 pills 3 times a day.  Drink plenty of fluids.  Return for worsening shortness of breath, headache, confusion. Follow up with your family doctor.   

## 2017-05-14 LAB — CULTURE, GROUP A STREP (THRC)

## 2017-10-26 ENCOUNTER — Other Ambulatory Visit: Payer: Self-pay

## 2017-10-26 ENCOUNTER — Encounter (HOSPITAL_BASED_OUTPATIENT_CLINIC_OR_DEPARTMENT_OTHER): Payer: Self-pay | Admitting: *Deleted

## 2017-10-26 ENCOUNTER — Emergency Department (HOSPITAL_BASED_OUTPATIENT_CLINIC_OR_DEPARTMENT_OTHER)
Admission: EM | Admit: 2017-10-26 | Discharge: 2017-10-26 | Disposition: A | Discharge status: Home / Self Care | Payer: Medicaid Other | Attending: Emergency Medicine | Admitting: Emergency Medicine

## 2017-10-26 DIAGNOSIS — H6501 Acute serous otitis media, right ear: Secondary | ICD-10-CM | POA: Insufficient documentation

## 2017-10-26 DIAGNOSIS — B9789 Other viral agents as the cause of diseases classified elsewhere: Secondary | ICD-10-CM

## 2017-10-26 DIAGNOSIS — J069 Acute upper respiratory infection, unspecified: Secondary | ICD-10-CM | POA: Insufficient documentation

## 2017-10-26 DIAGNOSIS — R05 Cough: Secondary | ICD-10-CM | POA: Diagnosis present

## 2017-10-26 DIAGNOSIS — Z87891 Personal history of nicotine dependence: Secondary | ICD-10-CM | POA: Insufficient documentation

## 2017-10-26 DIAGNOSIS — Z79899 Other long term (current) drug therapy: Secondary | ICD-10-CM | POA: Insufficient documentation

## 2017-10-26 MED ORDER — BENZONATATE 100 MG PO CAPS: 100.0000 mg | ORAL_CAPSULE | Freq: Three times a day (TID) | ORAL | 0 refills | Status: DC

## 2017-10-26 MED ORDER — AMOXICILLIN 500 MG PO CAPS: 1000.0000 mg | ORAL_CAPSULE | Freq: Two times a day (BID) | ORAL | 0 refills | Status: DC

## 2017-10-26 MED FILL — AMOXICILLIN 500 MG CAPSULE: 500 | 10 days supply | Qty: 40 | Fill #0

## 2017-10-26 NOTE — ED Notes (Signed)
Patient seen and assessed by the EPD prior to this RN seeing the patient. The patient is A/o x 3 upon d/c and without any noted distress

## 2017-10-26 NOTE — Discharge Instructions (Signed)
Take tylenol 2 pills 4 times a day and motrin 4 pills 3 times a day.  Drink plenty of fluids.  Return for worsening shortness of breath, headache, confusion. Follow up with your family doctor.   

## 2017-10-26 NOTE — ED Triage Notes (Signed)
Cough, body aches, sneezing, congestion, runny nose x 3 days.

## 2017-10-26 NOTE — ED Provider Notes (Signed)
[facility] EMERGENCY DEPARTMENT Provider Note   CSN: [id number] Arrival date & time: 10/26/17  1120     History   Chief Complaint Chief Complaint  Patient presents with  . Cough    HPI Elaine Chase is a 41 y.o. female.  41 yo F with a chief complaint of cough congestion chills and myalgias.  This been going on for about 3 days.  Unsure of sick contacts.  Has been having some right ear pain as well.  Remote smoking history.  Has tried over-the-counter medicines with minimal relief.  Denies abdominal pain nausea vomiting or diarrhea.  Denies hemoptysis.   The history is provided by the patient.  Cough  Associated symptoms include chills, ear pain and myalgias. Pertinent negatives include no chest pain, no headaches, no rhinorrhea, no shortness of breath, no wheezing and no eye redness.  Illness  This is a new problem. The current episode started 2 days ago. The problem occurs constantly. The problem has not changed since onset.Pertinent negatives include no chest pain, no headaches and no shortness of breath. Nothing aggravates the symptoms. Nothing relieves the symptoms. She has tried nothing for the symptoms. The treatment provided no relief.    Past Medical History:  Diagnosis Date  . Hidradenitis suppurativa     There are no active problems to display for this patient.   Past Surgical History:  Procedure Laterality Date  . cellulitis     cellulitis debridement    OB History    No data available       Home Medications    Prior to Admission medications   Medication Sig Start Date End Date Taking? Authorizing Provider  amoxicillin (AMOXIL) 500 MG capsule Take 2 capsules (1,000 mg total) by mouth 2 (two) times daily. 10/26/17   Melene Plan, DO  azithromycin (ZITHROMAX) 250 MG tablet Take 1 tablet (250 mg total) by mouth daily. Take first 2 tablets together, then 1 every day until finished. 08/26/13   Janne Napoleon, NP  benzonatate (TESSALON) 100 MG  capsule Take 1 capsule (100 mg total) by mouth every 8 (eight) hours. 10/26/17   Melene Plan, DO  doxycycline (VIBRAMYCIN) 100 MG capsule Take 1 capsule (100 mg total) by mouth 2 (two) times daily. One po bid x 7 days 05/11/17   Melene Plan, DO  guaiFENesin (ROBITUSSIN) 100 MG/5ML liquid Take 5-10 mLs (100-200 mg total) by mouth every 4 (four) hours as needed for cough. 08/26/13   Janne Napoleon, NP  hydrOXYzine (VISTARIL) 100 MG capsule Take 1 capsule (100 mg total) by mouth 3 (three) times daily as needed for itching. 10/23/12   Hess, Nada Boozer, PA-C  predniSONE (DELTASONE) 20 MG tablet Take 3 tabs PO x 2 days followed by 2 tabs PO x 2 days followed by 1 tab PO x 2 days 10/23/12   Paulina Fusi, Nada Boozer, PA-C  triamcinolone cream (KENALOG) 0.1 % Apply topically 2 (two) times daily. 10/23/12   Hess, Nada Boozer, PA-C    Family History No family history on file.  Social History Social History   Tobacco Use  . Smoking status: Former Smoker    Types: Cigarettes  . Smokeless tobacco: Never Used  Substance Use Topics  . Alcohol use: Yes    Comment: occasional  . Drug use: No     Allergies   Clindamycin/lincomycin; Darvocet [propoxyphene n-acetaminophen]; and Percocet [oxycodone-acetaminophen]   Review of Systems Review of Systems  Constitutional: Positive for chills. Negative for fever.  HENT:  Positive for congestion and ear pain. Negative for rhinorrhea.   Eyes: Negative for redness and visual disturbance.  Respiratory: Positive for cough. Negative for shortness of breath and wheezing.   Cardiovascular: Negative for chest pain and palpitations.  Gastrointestinal: Negative for nausea and vomiting.  Genitourinary: Negative for dysuria and urgency.  Musculoskeletal: Positive for myalgias. Negative for arthralgias.  Skin: Negative for pallor and wound.  Neurological: Negative for dizziness and headaches.     Physical Exam Updated Vital Signs BP 117/76   Pulse 90   Temp 98.1 F (36.7 C) (Oral)    Resp 16   Ht 5\' 11"  (1.803 m)   Wt 56.7 kg (125 lb)   LMP 10/23/2017   SpO2 100%   BMI 17.43 kg/m   Physical Exam  Constitutional: She is oriented to person, place, and time. She appears well-developed and well-nourished. No distress.  HENT:  Head: Normocephalic and atraumatic.  Swollen turbinates, posterior nasal drip, no noted sinus ttp, the right TM has some increased erythema as well as a serous effusion and bulging of the landmarks.  Left TM is normal.  Eyes: EOM are normal. Pupils are equal, round, and reactive to light.  Neck: Normal range of motion. Neck supple.  Cardiovascular: Normal rate and regular rhythm. Exam reveals no gallop and no friction rub.  No murmur heard. Pulmonary/Chest: Effort normal. She has no wheezes. She has no rales.  Abdominal: Soft. She exhibits no distension. There is no tenderness.  Musculoskeletal: She exhibits no edema or tenderness.  Neurological: She is alert and oriented to person, place, and time.  Skin: Skin is warm and dry. She is not diaphoretic.  Psychiatric: She has a normal mood and affect. Her behavior is normal.  Nursing note and vitals reviewed.    ED Treatments / Results  Labs (all labs ordered are listed, but only abnormal results are displayed) Labs Reviewed - No data to display  EKG  EKG Interpretation None       Radiology No results found.  Procedures Procedures (including critical care time)  Medications Ordered in ED Medications - No data to display   Initial Impression / Assessment and Plan / ED Course  I have reviewed the triage vital signs and the nursing notes.  Pertinent labs & imaging results that were available during my care of the patient were reviewed by me and considered in my medical decision making (see chart for details).     41 yo F with URI like symptoms.  This been going on for the past 3 days.  She clinically is well-appearing.  Has a URI on exam.  Also likely with a right-sided otitis  media.  Will start on antibiotics.  PCP follow-up.  11:53 AM:  I have discussed the diagnosis/risks/treatment options with the patient and family and believe the pt to be eligible for discharge home to follow-up with PCP. We also discussed returning to the ED immediately if new or worsening sx occur. We discussed the sx which are most concerning (e.g., sudden worsening pain, fever, inability to tolerate by mouth) that necessitate immediate return. Medications administered to the patient during their visit and any new prescriptions provided to the patient are listed below.  Medications given during this visit Medications - No data to display   The patient appears reasonably screen and/or stabilized for discharge and I doubt any other medical condition or other [facility] requiring further screening, evaluation, or treatment in the ED at this time prior to discharge.  Final Clinical Impressions(s) / ED Diagnoses   Final diagnoses:  Viral URI with cough  Right acute serous otitis media, recurrence not specified    ED Discharge Orders        Ordered    amoxicillin (AMOXIL) 500 MG capsule  2 times daily     10/26/17 1142    benzonatate (TESSALON) 100 MG capsule  Every 8 hours     10/26/17 1142       Melene PlanFloyd, Mordche Hedglin, DO 10/26/17 1153

## 2018-02-03 ENCOUNTER — Other Ambulatory Visit: Payer: Self-pay | Admitting: Nurse Practitioner

## 2018-02-03 DIAGNOSIS — Z1231 Encounter for screening mammogram for malignant neoplasm of breast: Secondary | ICD-10-CM

## 2018-02-23 ENCOUNTER — Ambulatory Visit: Payer: Medicaid Other

## 2018-03-03 ENCOUNTER — Ambulatory Visit
Admission: RE | Admit: 2018-03-03 | Discharge: 2018-03-03 | Disposition: A | Discharge status: Home / Self Care | Payer: Medicaid Other | Source: Ambulatory Visit | Attending: Nurse Practitioner | Admitting: Nurse Practitioner

## 2018-03-03 DIAGNOSIS — Z1231 Encounter for screening mammogram for malignant neoplasm of breast: Secondary | ICD-10-CM

## 2018-03-07 ENCOUNTER — Other Ambulatory Visit: Payer: Self-pay | Admitting: Nurse Practitioner

## 2018-03-07 DIAGNOSIS — R928 Other abnormal and inconclusive findings on diagnostic imaging of breast: Secondary | ICD-10-CM

## 2018-03-09 ENCOUNTER — Ambulatory Visit
Admission: RE | Admit: 2018-03-09 | Discharge: 2018-03-09 | Disposition: A | Discharge status: Home / Self Care | Payer: Medicaid Other | Source: Ambulatory Visit | Attending: Nurse Practitioner | Admitting: Nurse Practitioner

## 2018-03-09 ENCOUNTER — Other Ambulatory Visit: Payer: Self-pay | Admitting: Nurse Practitioner

## 2018-03-09 DIAGNOSIS — N6310 Unspecified lump in the right breast, unspecified quadrant: Secondary | ICD-10-CM

## 2018-03-09 DIAGNOSIS — R928 Other abnormal and inconclusive findings on diagnostic imaging of breast: Secondary | ICD-10-CM

## 2018-09-14 ENCOUNTER — Other Ambulatory Visit: Payer: Medicaid Other

## 2018-11-28 ENCOUNTER — Emergency Department (HOSPITAL_COMMUNITY)
Admission: EM | Admit: 2018-11-28 | Discharge: 2018-11-28 | Disposition: A | Discharge status: Home / Self Care | Payer: Medicaid Other | Attending: Emergency Medicine | Admitting: Emergency Medicine

## 2018-11-28 ENCOUNTER — Encounter (HOSPITAL_COMMUNITY): Payer: Self-pay

## 2018-11-28 DIAGNOSIS — K644 Residual hemorrhoidal skin tags: Secondary | ICD-10-CM | POA: Diagnosis not present

## 2018-11-28 DIAGNOSIS — Z87891 Personal history of nicotine dependence: Secondary | ICD-10-CM | POA: Insufficient documentation

## 2018-11-28 DIAGNOSIS — K649 Unspecified hemorrhoids: Secondary | ICD-10-CM

## 2018-11-28 MED ORDER — MORPHINE SULFATE (PF) 4 MG/ML IV SOLN
4.0000 mg | Freq: Once | INTRAVENOUS | Status: AC
  Administered 2018-11-28: 4 mg via INTRAMUSCULAR

## 2018-11-28 MED ORDER — TRAMADOL HCL 50 MG PO TABS: 50.0000 mg | ORAL_TABLET | Freq: Two times a day (BID) | ORAL | 0 refills | Status: DC | PRN

## 2018-11-28 MED ORDER — LIDOCAINE HCL URETHRAL/MUCOSAL 2 % EX GEL
1.0000 "application " | Freq: Once | CUTANEOUS | Status: AC
  Administered 2018-11-28: 1
  Filled 2018-11-28: qty 5

## 2018-11-28 MED ORDER — MORPHINE SULFATE (PF) 4 MG/ML IV SOLN
4.0000 mg | Freq: Once | INTRAVENOUS | Status: DC
  Filled 2018-11-28: qty 1

## 2018-11-28 MED ORDER — HYDROCORTISONE 2.5 % RE CREA: TOPICAL_CREAM | RECTAL | 0 refills | Status: DC

## 2018-11-28 NOTE — ED Notes (Signed)
Pt given education regarding pain medication. Pt is being driven home by family.  

## 2018-11-28 NOTE — ED Provider Notes (Signed)
[facility]-EMERGENCY DEPT Provider Note   CSN: [id number] Arrival date & time: 11/28/18  1913     History   Chief Complaint No chief complaint on file.   HPI Elaine Chase is a 43 y.o. female.  HPI   43 year old female with history of hidradenitis suppurativa who presents emergency department today for evaluation of hemorrhoids that have been present for at least 1 week.  She states she saw her PCP about 1 week ago and was told that she did not have any thrombosed hemorrhoids.  She was told to complete sitz bath at home.  She states she has been doing this however her pain worsened.  Pain constant and severe in nature.  She has not tried any medications for her pain  Past Medical History:  Diagnosis Date  . Hidradenitis suppurativa     There are no active problems to display for this patient.   Past Surgical History:  Procedure Laterality Date  . cellulitis     cellulitis debridement     OB History   No obstetric history on file.      Home Medications    Prior to Admission medications   Medication Sig Start Date End Date Taking? Authorizing Provider  amoxicillin (AMOXIL) 500 MG capsule Take 2 capsules (1,000 mg total) by mouth 2 (two) times daily. 10/26/17   Melene Plan, DO  azithromycin (ZITHROMAX) 250 MG tablet Take 1 tablet (250 mg total) by mouth daily. Take first 2 tablets together, then 1 every day until finished. 08/26/13   Janne Napoleon, NP  benzonatate (TESSALON) 100 MG capsule Take 1 capsule (100 mg total) by mouth every 8 (eight) hours. 10/26/17   Melene Plan, DO  doxycycline (VIBRAMYCIN) 100 MG capsule Take 1 capsule (100 mg total) by mouth 2 (two) times daily. One po bid x 7 days 05/11/17   Melene Plan, DO  guaiFENesin (ROBITUSSIN) 100 MG/5ML liquid Take 5-10 mLs (100-200 mg total) by mouth every 4 (four) hours as needed for cough. 08/26/13   Janne Napoleon, NP  hydrocortisone (ANUSOL-HC) 2.5 % rectal cream Apply rectally 2 times daily  11/28/18   Keiara Sneeringer S, PA-C  hydrOXYzine (VISTARIL) 100 MG capsule Take 1 capsule (100 mg total) by mouth 3 (three) times daily as needed for itching. 10/23/12   Hess, Nada Boozer, PA-C  predniSONE (DELTASONE) 20 MG tablet Take 3 tabs PO x 2 days followed by 2 tabs PO x 2 days followed by 1 tab PO x 2 days 10/23/12   Hess, Nada Boozer, PA-C  traMADol (ULTRAM) 50 MG tablet Take 1 tablet (50 mg total) by mouth every 12 (twelve) hours as needed for up to 3 doses. 11/28/18   Stephaun Million S, PA-C  triamcinolone cream (KENALOG) 0.1 % Apply topically 2 (two) times daily. 10/23/12   Hess, Nada Boozer, PA-C    Family History History reviewed. No pertinent family history.  Social History Social History   Tobacco Use  . Smoking status: Former Smoker    Types: Cigarettes  . Smokeless tobacco: Never Used  Substance Use Topics  . Alcohol use: Yes    Comment: occasional  . Drug use: No     Allergies   Clindamycin/lincomycin; Darvocet [propoxyphene n-acetaminophen]; and Percocet [oxycodone-acetaminophen]   Review of Systems Review of Systems  Constitutional: Negative for chills and fever.  HENT: Negative for congestion.   Eyes: Negative for visual disturbance.  Respiratory: Negative for cough and shortness of breath.   Cardiovascular: Negative for  chest pain.  Gastrointestinal: Negative for abdominal pain, blood in stool, constipation, diarrhea, nausea and vomiting.       Rectal pain  Genitourinary: Negative for dysuria.  Musculoskeletal: Negative for back pain.  Skin: Negative for rash.  Neurological: Negative for headaches.     Physical Exam Updated Vital Signs BP (!) 97/54 (BP Location: Left Arm)   Pulse 79   Temp 97.7 F (36.5 C) (Oral)   Resp 16   SpO2 96%   Physical Exam Vitals signs and nursing note reviewed.  Constitutional:      General: She is not in acute distress.    Appearance: She is well-developed.  HENT:     Head: Normocephalic and atraumatic.  Eyes:      Conjunctiva/sclera: Conjunctivae normal.  Neck:     Musculoskeletal: Neck supple.  Cardiovascular:     Rate and Rhythm: Normal rate and regular rhythm.     Heart sounds: No murmur.  Pulmonary:     Effort: Pulmonary effort is normal. No respiratory distress.     Breath sounds: Normal breath sounds.  Abdominal:     Palpations: Abdomen is soft.     Tenderness: There is no abdominal tenderness.  Genitourinary:    Comments: Chaperone present for DRE. Pt with very small external hemorrhoid that are nonthrombosed. No obvious internal hemorrhoids.  No erythema induration or fluctuance noted.  No significant pain with digital rectal exam. Skin:    General: Skin is warm and dry.  Neurological:     Mental Status: She is alert.      ED Treatments / Results  Labs (all labs ordered are listed, but only abnormal results are displayed) Labs Reviewed - No data to display  EKG None  Radiology No results found.  Procedures Procedures (including critical care time)  Medications Ordered in ED Medications  lidocaine (XYLOCAINE) 2 % jelly 1 application (1 application Other Given 11/28/18 2129)  morphine 4 MG/ML injection 4 mg (4 mg Intramuscular Given 11/28/18 2127)     Initial Impression / Assessment and Plan / ED Course  I have reviewed the triage vital signs and the nursing notes.  Pertinent labs & imaging results that were available during my care of the patient were reviewed by me and considered in my medical decision making (see chart for details).     Final Clinical Impressions(s) / ED Diagnoses   Final diagnoses:  Hemorrhoids, unspecified hemorrhoid type   Patient presenting with complaints of rectal pain secondary to external hemorrhoids.  Was recently seen by PCP and was told that they were nonthrombosed.  She has been doing sitz bath at home with no improvement of symptoms.  She is not tried any medications for her symptoms.  States pain has been worsening since onset and is  constant.  On exam she has very small external hemorrhoids, no evidence of thrombosed hemorrhoids.  No evidence of perianal or perirectal abscess.  Patient actually had no significant on rectal exam.  Will give referral to general surgery since her pain is worsened.  Have advised follow-up with her PCP.  Will give Rx for Anusol and very short course of pain medications.  Advised that pain medications may cause constipation and hard stools and that she needs to take MiraLAX if she begins to have hard stools.  To return if worse.  She voiced understanding the plan reasons to return the ED.  All questions answered.  ED Discharge Orders         Ordered  hydrocortisone (ANUSOL-HC) 2.5 % rectal cream     11/28/18 2219    traMADol (ULTRAM) 50 MG tablet  Every 12 hours PRN     11/28/18 2219           Karrie Meres, PA-C 11/28/18 2226    Tegeler, Canary Brim, MD 11/29/18 [phone]

## 2018-11-28 NOTE — ED Triage Notes (Signed)
Pt complains of hemorrhoid pain for one week, today she had a BM and felt as if something was torn, pt is laying on her side on the stretcher and crying Pt was on her way her and stopped on the side of the rode and called EMS

## 2018-11-28 NOTE — Discharge Instructions (Signed)
Please continue doing sitz bath at home help with your symptoms.  You were given medication to help with your hemorrhoids.  Prescription given for Tramadol. Take medication as directed and do not operate machinery, drive a car, or work while taking this medication as it can make you drowsy.   Make sure that you continue to have soft stools.  If you have any hard stools or constipation then you should take 1 capful of MiraLAX daily until your stools are soft.  No referral to general surgery regards to your hemorrhoids today.  You can call the office to make an appointment for follow-up.  You may also follow-up with your regular doctor regards your symptoms today.  Return to the ER for new or worsening symptoms in the meantime.

## 2018-12-19 ENCOUNTER — Encounter (HOSPITAL_COMMUNITY): Payer: Self-pay | Admitting: Emergency Medicine

## 2018-12-19 ENCOUNTER — Other Ambulatory Visit: Payer: Self-pay

## 2018-12-19 ENCOUNTER — Emergency Department (HOSPITAL_COMMUNITY): Payer: Medicaid Other

## 2018-12-19 ENCOUNTER — Emergency Department (HOSPITAL_COMMUNITY)
Admission: EM | Admit: 2018-12-19 | Discharge: 2018-12-19 | Disposition: A | Discharge status: Home / Self Care | Payer: Medicaid Other | Attending: Emergency Medicine | Admitting: Emergency Medicine

## 2018-12-19 DIAGNOSIS — Z87891 Personal history of nicotine dependence: Secondary | ICD-10-CM | POA: Insufficient documentation

## 2018-12-19 DIAGNOSIS — N764 Abscess of vulva: Secondary | ICD-10-CM | POA: Insufficient documentation

## 2018-12-19 DIAGNOSIS — R102 Pelvic and perineal pain: Secondary | ICD-10-CM | POA: Diagnosis present

## 2018-12-19 LAB — CBC WITH DIFFERENTIAL/PLATELET
Abs Immature Granulocytes: 0.03 10*3/uL (ref 0.00–0.07)
Basophils Absolute: 0 10*3/uL (ref 0.0–0.1)
Basophils Relative: 0 %
Eosinophils Absolute: 0.1 10*3/uL (ref 0.0–0.5)
Eosinophils Relative: 1 %
HCT: 42.7 % (ref 36.0–46.0)
Hemoglobin: 13.9 g/dL (ref 12.0–15.0)
Immature Granulocytes: 0 %
Lymphocytes Relative: 23 %
Lymphs Abs: 2.6 10*3/uL (ref 0.7–4.0)
MCH: 30.2 pg (ref 26.0–34.0)
MCHC: 32.6 g/dL (ref 30.0–36.0)
MCV: 92.8 fL (ref 80.0–100.0)
Monocytes Absolute: 0.8 10*3/uL (ref 0.1–1.0)
Monocytes Relative: 7 %
Neutro Abs: 7.9 10*3/uL — ABNORMAL HIGH (ref 1.7–7.7)
Neutrophils Relative %: 69 %
Platelets: 270 10*3/uL (ref 150–400)
RBC: 4.6 MIL/uL (ref 3.87–5.11)
RDW: 12.9 % (ref 11.5–15.5)
WBC: 11.5 10*3/uL — ABNORMAL HIGH (ref 4.0–10.5)
nRBC: 0 % (ref 0.0–0.2)

## 2018-12-19 LAB — COMPREHENSIVE METABOLIC PANEL
ALT: 19 U/L (ref 0–44)
AST: 27 U/L (ref 15–41)
Albumin: 3.8 g/dL (ref 3.5–5.0)
Alkaline Phosphatase: 84 U/L (ref 38–126)
Anion gap: 7 (ref 5–15)
BUN: 8 mg/dL (ref 6–20)
CO2: 25 mmol/L (ref 22–32)
Calcium: 8.7 mg/dL — ABNORMAL LOW (ref 8.9–10.3)
Chloride: 106 mmol/L (ref 98–111)
Creatinine, Ser: 0.76 mg/dL (ref 0.44–1.00)
GFR calc Af Amer: >60 mL/min (ref >60)
GFR calc non Af Amer: >60 mL/min (ref >60)
Glucose, Bld: 79 mg/dL (ref 70–99)
Potassium: 5.2 mmol/L — ABNORMAL HIGH (ref 3.5–5.1)
Sodium: 138 mmol/L (ref 135–145)
Total Bilirubin: 1.4 mg/dL — ABNORMAL HIGH (ref 0.3–1.2)
Total Protein: 7.7 g/dL (ref 6.5–8.1)

## 2018-12-19 LAB — I-STAT BETA HCG BLOOD, ED (MC, WL, AP ONLY): I-stat hCG, quantitative: <5 m[IU]/mL (ref <5)

## 2018-12-19 LAB — POTASSIUM: Potassium: 2.8 mmol/L — ABNORMAL LOW (ref 3.5–5.1)

## 2018-12-19 LAB — LACTIC ACID, PLASMA: Lactic Acid, Venous: 1.1 mmol/L (ref 0.5–1.9)

## 2018-12-19 MED ORDER — DOXYCYCLINE HYCLATE 100 MG PO CAPS: 100.0000 mg | ORAL_CAPSULE | Freq: Two times a day (BID) | ORAL | 0 refills | Status: AC

## 2018-12-19 MED ORDER — LORAZEPAM 2 MG/ML IJ SOLN
0.2500 mg | Freq: Once | INTRAMUSCULAR | Status: AC
  Administered 2018-12-19: 0.25 mg via INTRAVENOUS
  Filled 2018-12-19: qty 1

## 2018-12-19 MED ORDER — MORPHINE SULFATE (PF) 4 MG/ML IV SOLN
4.0000 mg | Freq: Once | INTRAVENOUS | Status: AC
  Administered 2018-12-19: 4 mg via INTRAVENOUS
  Filled 2018-12-19: qty 1

## 2018-12-19 MED ORDER — ONDANSETRON HCL 4 MG/2ML IJ SOLN
4.0000 mg | Freq: Once | INTRAMUSCULAR | Status: AC
  Administered 2018-12-19: 4 mg via INTRAVENOUS
  Filled 2018-12-19: qty 2

## 2018-12-19 MED ORDER — OXYCODONE-ACETAMINOPHEN 5-325 MG PO TABS
1.0000 | ORAL_TABLET | Freq: Once | ORAL | Status: AC
  Administered 2018-12-19: 1 via ORAL
  Filled 2018-12-19: qty 1

## 2018-12-19 MED ORDER — LIDOCAINE-EPINEPHRINE-TETRACAINE (LET) SOLUTION
3.0000 mL | Freq: Once | NASAL | Status: AC
  Administered 2018-12-19: 3 mL via TOPICAL
  Filled 2018-12-19: qty 3

## 2018-12-19 MED ORDER — IOPAMIDOL (ISOVUE-300) INJECTION 61%
100.0000 mL | Freq: Once | INTRAVENOUS | Status: AC | PRN
  Administered 2018-12-19: 100 mL via INTRAVENOUS

## 2018-12-19 MED ORDER — SODIUM CHLORIDE 0.9 % IV BOLUS
1000.0000 mL | Freq: Once | INTRAVENOUS | Status: AC
  Administered 2018-12-19: 1000 mL via INTRAVENOUS

## 2018-12-19 MED ORDER — HYDROMORPHONE HCL 1 MG/ML IJ SOLN
0.5000 mg | Freq: Once | INTRAMUSCULAR | Status: AC
  Administered 2018-12-19: 0.5 mg via INTRAVENOUS
  Filled 2018-12-19: qty 1

## 2018-12-19 MED ORDER — OXYCODONE-ACETAMINOPHEN 5-325 MG PO TABS: 1.0000 | ORAL_TABLET | Freq: Three times a day (TID) | ORAL | 0 refills | Status: DC | PRN

## 2018-12-19 MED ORDER — LIDOCAINE HCL 2 % IJ SOLN
10.0000 mL | Freq: Once | INTRAMUSCULAR | Status: AC
  Administered 2018-12-19: 200 mg via INTRADERMAL
  Filled 2018-12-19: qty 20

## 2018-12-19 MED ORDER — HYDROCODONE-ACETAMINOPHEN 5-325 MG PO TABS: 2.0000 | ORAL_TABLET | Freq: Three times a day (TID) | ORAL | 0 refills | Status: DC | PRN

## 2018-12-19 NOTE — ED Provider Notes (Signed)
[facility]-EMERGENCY DEPT Provider Note   CSN: [id number] Arrival date & time: 12/19/18  1031     History   Chief Complaint Chief Complaint  Patient presents with  . Abscess    HPI Elaine Chase is a 43 y.o. female.  HPI   Elaine Chase is a 43 y.o. female, with a history of hidradenitis suppurativa, presenting to the ED with complaint of pain and swelling to the right labia for the last week.  She has had other similar areas of swelling in other regions of her body in the past.  She endorses subjective fever and chills.  No drainage from the region. Pain is severe, sharp, radiating into the lower pelvis. Denies abdominal pain, abnormal vaginal discharge, rectal pain, drainage from the region, vomiting, or any other complaints.    Past Medical History:  Diagnosis Date  . Hidradenitis suppurativa     There are no active problems to display for this patient.   Past Surgical History:  Procedure Laterality Date  . cellulitis     cellulitis debridement     OB History   No obstetric history on file.      Home Medications    Prior to Admission medications   Medication Sig Start Date End Date Taking? Authorizing Provider  amoxicillin (AMOXIL) 500 MG capsule Take 2 capsules (1,000 mg total) by mouth 2 (two) times daily. Patient not taking: Reported on 12/19/2018 10/26/17   Melene Plan, DO  azithromycin (ZITHROMAX) 250 MG tablet Take 1 tablet (250 mg total) by mouth daily. Take first 2 tablets together, then 1 every day until finished. Patient not taking: Reported on 12/19/2018 08/26/13   Janne Napoleon, NP  benzonatate (TESSALON) 100 MG capsule Take 1 capsule (100 mg total) by mouth every 8 (eight) hours. Patient not taking: Reported on 12/19/2018 10/26/17   Melene Plan, DO  doxycycline (VIBRAMYCIN) 100 MG capsule Take 1 capsule (100 mg total) by mouth 2 (two) times daily. One po bid x 7 days Patient not taking: Reported on 12/19/2018 05/11/17   Melene Plan, DO  guaiFENesin (ROBITUSSIN) 100 MG/5ML liquid Take 5-10 mLs (100-200 mg total) by mouth every 4 (four) hours as needed for cough. Patient not taking: Reported on 12/19/2018 08/26/13   Janne Napoleon, NP  hydrocortisone (ANUSOL-HC) 2.5 % rectal cream Apply rectally 2 times daily Patient not taking: Reported on 12/19/2018 11/28/18   Couture, Cortni S, PA-C  hydrOXYzine (VISTARIL) 100 MG capsule Take 1 capsule (100 mg total) by mouth 3 (three) times daily as needed for itching. Patient not taking: Reported on 12/19/2018 10/23/12   Hess, Nada Boozer, PA-C  predniSONE (DELTASONE) 20 MG tablet Take 3 tabs PO x 2 days followed by 2 tabs PO x 2 days followed by 1 tab PO x 2 days Patient not taking: Reported on 12/19/2018 10/23/12   Hess, Nada Boozer, PA-C  traMADol (ULTRAM) 50 MG tablet Take 1 tablet (50 mg total) by mouth every 12 (twelve) hours as needed for up to 3 doses. Patient not taking: Reported on 12/19/2018 11/28/18   Couture, Cortni S, PA-C  triamcinolone cream (KENALOG) 0.1 % Apply topically 2 (two) times daily. Patient not taking: Reported on 12/19/2018 10/23/12   Kathrynn Speed, PA-C    Family History History reviewed. No pertinent family history.  Social History Social History   Tobacco Use  . Smoking status: Former Smoker    Types: Cigarettes  . Smokeless tobacco: Never Used  Substance Use Topics  .  Alcohol use: Yes    Comment: occasional  . Drug use: No     Allergies   Codeine; Clindamycin/lincomycin; Darvocet [propoxyphene n-acetaminophen]; and Percocet [oxycodone-acetaminophen]   Review of Systems Review of Systems  Constitutional: Positive for fever.  Gastrointestinal: Negative for abdominal pain, nausea and vomiting.  Genitourinary: Negative for vaginal bleeding and vaginal discharge.       Labial swelling and pain  Musculoskeletal: Negative for back pain.  Skin: Positive for color change.  Neurological: Negative for weakness and numbness.  All other systems reviewed and are  negative.    Physical Exam Updated Vital Signs BP 128/82 (BP Location: Right Arm)   Pulse 73   Temp (!) 97.4 F (36.3 C) (Oral)   Resp 13   Ht 5\' 11"  (1.803 m)   Wt 66.2 kg   LMP 12/19/2018   SpO2 100%   BMI 20.36 kg/m   Physical Exam Vitals signs and nursing note reviewed.  Constitutional:      General: She is not in acute distress.    Appearance: She is well-developed. She is not diaphoretic.  HENT:     Head: Normocephalic and atraumatic.     Mouth/Throat:     Mouth: Mucous membranes are moist.     Pharynx: Oropharynx is clear.  Eyes:     Conjunctiva/sclera: Conjunctivae normal.  Neck:     Musculoskeletal: Neck supple.  Cardiovascular:     Rate and Rhythm: Normal rate and regular rhythm.     Pulses: Normal pulses.     Heart sounds: Normal heart sounds.  Pulmonary:     Effort: Pulmonary effort is normal. No respiratory distress.     Breath sounds: Normal breath sounds.  Abdominal:     Palpations: Abdomen is soft.     Tenderness: There is no abdominal tenderness. There is no guarding.  Genitourinary:    Comments: Exquisite tenderness and swelling to the right labia estimated to be between 4 and 5 cm and extends the length of the labia.  Tenderness extends into the right inguinal region. No noted tenderness to the perineal region. Musculoskeletal:     Right lower leg: No edema.     Left lower leg: No edema.  Lymphadenopathy:     Cervical: No cervical adenopathy.  Skin:    General: Skin is warm and dry.  Neurological:     Mental Status: She is alert.  Psychiatric:        Mood and Affect: Mood and affect normal.        Speech: Speech normal.        Behavior: Behavior normal.            ED Treatments / Results  Labs (all labs ordered are listed, but only abnormal results are displayed) Labs Reviewed  COMPREHENSIVE METABOLIC PANEL - Abnormal; Notable for the following components:      Result Value   Potassium 5.2 (*)    Calcium 8.7 (*)    Total  Bilirubin 1.4 (*)    All other components within normal limits  CBC WITH DIFFERENTIAL/PLATELET - Abnormal; Notable for the following components:   WBC 11.5 (*)    Neutro Abs 7.9 (*)    All other components within normal limits  LACTIC ACID, PLASMA  I-STAT BETA HCG BLOOD, ED ([facility], [facility], [facility] ONLY)    EKG None  Radiology No results found.  Procedures Procedures (including critical care time)  Medications Ordered in ED Medications  morphine 4 MG/ML injection 4 mg (has no  administration in time range)  ondansetron (ZOFRAN) injection 4 mg (4 mg Intravenous Given 12/19/18 1332)  sodium chloride 0.9 % bolus 1,000 mL (1,000 mLs Intravenous New Bag/Given 12/19/18 1332)  morphine 4 MG/ML injection 4 mg (4 mg Intravenous Given 12/19/18 1333)     Initial Impression / Assessment and Plan / ED Course  I have reviewed the triage vital signs and the nursing notes.  Pertinent labs & imaging results that were available during my care of the patient were reviewed by me and considered in my medical decision making (see chart for details).  Clinical Course as of Dec 19 1541  Mon Dec 19, 2018  1239 Spoke with Dr. Jorja Loaafeen, Dermatologist.  Patient was seen in his office today.  He assessed the labial abscess and noted it was quite large.  He initially tried to prep the patient for I&D, however, she would not stay calm enough to allow this. He [facility] to get her seen in the office for I&D with sedation, but states he was not given a straight answer and eventually they told him to send the patient to the ED.   [SJ]    Clinical Course User Index [SJ] Yarisa Lynam C, PA-C    Patient presents with labial abscess.  Nontoxic-appearing, not tachycardic, not tachypnea, afebrile, not hypotensive.  Doubt sepsis.  She is in quite a bit of pain and the swollen area is quite large.  There is tenderness into the right inguinal region, which is concern for possible deeper abscess.  End of shift  patient care handoff report given to Peter Kiewit SonsCortni Couture, PA-C. Plan: CT of pelvis pending.  Repeat potassium also pending.  Will likely need to make contact with OB/GYN versus general surgery for assistance with abscess drainage.  Vitals:   12/19/18 1100 12/19/18 1200 12/19/18 1230 12/19/18 1541  BP: 130/80 131/83 128/84 (!) 126/112  Pulse: 77 79 80 74  Resp: 15 17 (!) 22 15  Temp:      TempSrc:      SpO2: 100% 100% 99% 100%  Weight:      Height:         Final Clinical Impressions(s) / ED Diagnoses   Final diagnoses:  None    ED Discharge Orders    None       Concepcion LivingJoy, Krishon Adkison C, PA-C 12/19/18 1547    Arby BarrettePfeiffer, Marcy, MD 01/06/19 0041

## 2018-12-19 NOTE — ED Triage Notes (Signed)
Patient arrived by EMS from doctors office. Pt has abscess between her legs that causes pain.   Hx of abscess.   BP 132/84, HR 80, RR 22.

## 2018-12-19 NOTE — Discharge Instructions (Addendum)
You were given a prescription for antibiotics. Please take the antibiotic prescription fully.   Prescription given for perocet. Take medication as directed and do not operate machinery, drive a car, or work while taking this medication as it can make you drowsy.    Please complete warm sitz baths at home.   Please follow up with your dermatologist, the women's clinic, or the ED for re-evaluation of your abscess in 48 hours. Please return to the emergency room immediately if you experience any new or worsening symptoms or any symptoms that indicate worsening infection such as fevers, increased redness/swelling/pain, warmth, or drainage from the affected area.

## 2018-12-19 NOTE — ED Provider Notes (Signed)
Care assumed from [name], [place] will plan to f/u on pending CT scan of the pelvis. If complex abscess is present then consult with ob-gyn or gen surg consult may be warranted.  Per his note, "Elaine Chase is a 43 y.o. female, with a history of hidradenitis suppurativa, presenting to the ED with complaint of pain and swelling to the right labia for the last week.  She has had other similar areas of swelling in other regions of her body in the past.  She endorses subjective fever and chills.  No drainage from the region. Pain is severe, sharp, radiating into the lower pelvis. Denies abdominal pain, abnormal vaginal discharge, rectal pain, drainage from the region, vomiting, or any other complaints."  Physical Exam  BP 115/86   Pulse 88   Temp (!) 97.4 F (36.3 C) (Oral)   Resp 18   Ht 5\' 11"  (1.803 m)   Wt 66.2 kg   LMP 12/19/2018   SpO2 100%   BMI 20.36 kg/m   Physical Exam Vitals signs and nursing note reviewed.  Constitutional:      General: She is not in acute distress.    Appearance: She is well-developed.  HENT:     Head: Normocephalic and atraumatic.  Eyes:     Conjunctiva/sclera: Conjunctivae normal.  Neck:     Musculoskeletal: Neck supple.  Cardiovascular:     Rate and Rhythm: Normal rate.  Pulmonary:     Effort: Pulmonary effort is normal.  Genitourinary:    Comments: Chaperone present. Pt has large area of fluctuance (about 2x3cm) to the right mid labia. There is surrounding induration and erythema.  Musculoskeletal: Normal range of motion.  Skin:    General: Skin is warm and dry.  Neurological:     Mental Status: She is alert.     ED Course/Procedures   Results for orders placed or performed during the hospital encounter of 12/19/18  Comprehensive metabolic panel  Result Value Ref Range   Sodium 138 135 - 145 mmol/L   Potassium 5.2 (H) 3.5 - 5.1 mmol/L   Chloride 106 98 - 111 mmol/L   CO2 25 22 - 32 mmol/L   Glucose, Bld 79 70 - 99 mg/dL   BUN 8 6 -  20 mg/dL   Creatinine, Ser 1.610.76 0.44 - 1.00 mg/dL   Calcium 8.7 (L) 8.9 - 10.3 mg/dL   Total Protein 7.7 6.5 - 8.1 g/dL   Albumin 3.8 3.5 - 5.0 g/dL   AST 27 15 - 41 U/L   ALT 19 0 - 44 U/L   Alkaline Phosphatase 84 38 - 126 U/L   Total Bilirubin 1.4 (H) 0.3 - 1.2 mg/dL   GFR calc non Af Amer >60 >60 mL/min   GFR calc Af Amer >60 >60 mL/min   Anion gap 7 5 - 15  CBC with Differential  Result Value Ref Range   WBC 11.5 (H) 4.0 - 10.5 K/uL   RBC 4.60 3.87 - 5.11 MIL/uL   Hemoglobin 13.9 12.0 - 15.0 g/dL   HCT 09.642.7 04.536.0 - 40.946.0 %   MCV 92.8 80.0 - 100.0 fL   MCH 30.2 26.0 - 34.0 pg   MCHC 32.6 30.0 - 36.0 g/dL   RDW 81.112.9 91.411.5 - 78.215.5 %   Platelets 270 150 - 400 K/uL   nRBC 0.0 0.0 - 0.2 %   Neutrophils Relative % 69 %   Neutro Abs 7.9 (H) 1.7 - 7.7 K/uL   Lymphocytes Relative 23 %  Lymphs Abs 2.6 0.7 - 4.0 K/uL   Monocytes Relative 7 %   Monocytes Absolute 0.8 0.1 - 1.0 K/uL   Eosinophils Relative 1 %   Eosinophils Absolute 0.1 0.0 - 0.5 K/uL   Basophils Relative 0 %   Basophils Absolute 0.0 0.0 - 0.1 K/uL   Immature Granulocytes 0 %   Abs Immature Granulocytes 0.03 0.00 - 0.07 K/uL  Lactic acid, plasma  Result Value Ref Range   Lactic Acid, Venous 1.1 0.5 - 1.9 mmol/L  Potassium  Result Value Ref Range   Potassium 2.8 (L) 3.5 - 5.1 mmol/L  I-Stat beta hCG blood, ED  Result Value Ref Range   I-stat hCG, quantitative <5.0 <5 mIU/mL   Comment 3           Ct Pelvis W Contrast  Result Date: 12/19/2018 CLINICAL DATA:  Disease of Bartholin gland, RIGHT labial abscess with tenderness EXAM: CT PELVIS WITH CONTRAST TECHNIQUE: Multidetector CT imaging of the pelvis was performed using the standard protocol following the bolus administration of intravenous contrast. Sagittal and coronal MPR images reconstructed from axial data set. CONTRAST:  ISOVUE-300 IOPAMIDOL (ISOVUE-300) INJECTION 61% IV. No oral contrast. COMPARISON:  None FINDINGS: Urinary Tract:  Distal ureters and  bladder normal appearance Bowel: Normal appendix. Visualized large and small bowel loops unremarkable. Vascular/Lymphatic: Vascular structures patent and unremarkable. Enlarged RIGHT external iliac lymph node 22 mm short axis. Additional small RIGHT inguinal node 11 mm short axis and RIGHT external iliac node 13 mm short axis. Reproductive: Unremarkable ovaries. Two small enhancing nodules within the LEFT lateral aspect of the uterus question tiny leiomyomata. Other: Small amount of free pelvic fluid. Loculated bilobed fluid collection identified within the RIGHT labia majora 2.2 x 1.8 x 2.0 cm in size with surrounding hyperemia, soft tissue thickening and skin thickening consistent with an abscess. Tiny umbilical hernia containing fat. Musculoskeletal: Unremarkable IMPRESSION: Small subcutaneous bilobed abscess at the RIGHT labia majora 2.2 x 2.0 x 1.8 cm with surrounding inflammatory changes. Enlarged RIGHT external iliac lymph node likely reactive. Electronically Signed   By: Ulyses Southward M.D.   On: 12/19/2018 16:59     Clinical Course as of Dec 21 23  Mon Dec 19, 2018  1239 Spoke with Dr. Jorja Loa, Dermatologist.  Patient was seen in his office today.  He assessed the labial abscess and noted it was quite large.  He initially tried to prep the patient for I&D, however, she would not stay calm enough to allow this. He called [facility] to get her seen in the office for I&D with sedation, but states he was not given a straight answer and eventually they told him to send the patient to the ED.   [SJ]    Clinical Course User Index [SJ] Joy, [name], PA-C    .Marland KitchenIncision and Drainage Date/Time: 12/19/2018 11:38 PM Performed by: Karrie Meres, PA-C Authorized by: Karrie Meres, PA-C   Consent:    Consent obtained:  Verbal   Consent given by:  Patient   Risks discussed:  Bleeding, incomplete drainage and pain   Alternatives discussed:  No treatment Location:    Type:   Abscess   Size:  2   Location: right labia. Pre-procedure details:    Skin preparation:  Betadine Anesthesia (see MAR for exact dosages):    Anesthesia method:  Topical application and local infiltration   Topical anesthetic:  LET   Local anesthetic:  Lidocaine 2% w/o epi Procedure type:  Complexity:  Complex Procedure details:    Incision types:  Stab incision   Incision depth:  Dermal   Scalpel blade:  11   Wound management:  Probed and deloculated   Drainage:  Purulent and bloody   Drainage amount:  Copious   Wound treatment:  Wound left open   Packing materials:  None Post-procedure details:    Patient tolerance of procedure:  Procedure terminated at patient's request    MDM    Pt with a h/o hidradenitis here for eval for right labial abscess.   CBC with mild leukocytosis. No anemia.  CMP with slightly elevated potassium at 5.2 initially. Total bilirubin also slightly elevated.  Repeat potassium is low at 2.8. pt informed of results of both lab tests. Offered potassium recheck to confirm level and pt prefers to f/u as an outpatient for eval.   CT scan with small subcutaneous bilobed abscess at the RIGHT labia majora 2.2 x 2.0 x 1.8 cm with surrounding inflammatory changes. Enlarged RIGHT external iliac lymph node likely reactive.  Given abscess is small and there is no deep space involvement, likely appropriate for I&D in the ED.  Incision and drainage was completed in the ED with moderate purulent material obtained.  The I&D was probed and deloculated.  Patient was having significant pain with procedure and terminated the procedure slightly early.  Will advise her to continue sitz bath at home.  Will give Rx for antibiotic for home.  She is established with dermatology and I advised her to either follow-up with her dermatologist, the ED or with OB/GYN in 48 hours for wound recheck.  We also discussed her potassium levels as discussed above.  Patient will follow-up with her  PCP in regards to these abnormal levels noted today.  She is to return to the ER for any new or worsening symptoms in the meantime.  Patient voiced understanding of the plan and reasons to return.  All questions were answered.     Rayne Du 12/20/18 Evonnie Pat, MD 12/26/18 [phone]

## 2020-02-28 ENCOUNTER — Encounter (HOSPITAL_BASED_OUTPATIENT_CLINIC_OR_DEPARTMENT_OTHER): Payer: Self-pay | Admitting: *Deleted

## 2020-02-28 ENCOUNTER — Emergency Department (HOSPITAL_BASED_OUTPATIENT_CLINIC_OR_DEPARTMENT_OTHER)
Admission: EM | Admit: 2020-02-28 | Discharge: 2020-02-28 | Disposition: A | Discharge status: Home / Self Care | Payer: Medicaid Other | Attending: Emergency Medicine | Admitting: Emergency Medicine

## 2020-02-28 ENCOUNTER — Other Ambulatory Visit: Payer: Self-pay

## 2020-02-28 DIAGNOSIS — J302 Other seasonal allergic rhinitis: Secondary | ICD-10-CM | POA: Diagnosis not present

## 2020-02-28 DIAGNOSIS — Z87891 Personal history of nicotine dependence: Secondary | ICD-10-CM | POA: Insufficient documentation

## 2020-02-28 DIAGNOSIS — J029 Acute pharyngitis, unspecified: Secondary | ICD-10-CM | POA: Diagnosis not present

## 2020-02-28 MED ORDER — CETIRIZINE HCL 10 MG PO TABS: 10.0000 mg | ORAL_TABLET | Freq: Every day | ORAL | 1 refills | Status: AC

## 2020-02-28 MED FILL — CETIRIZINE HCL 10 MG TABS: 10 | 10 days supply | Qty: 10 | Fill #0

## 2020-02-28 NOTE — ED Triage Notes (Signed)
Throat to feel like burning and can not eat since yesterday.  Denies fever, n/v.

## 2020-02-28 NOTE — Discharge Instructions (Signed)
Symptoms seem to be consistent with allergy irritation.  Try the Zyrtec daily.  Also would recommend some Motrin for the throat irritation.  Return for any new or worse symptoms or if this treatment does not help.  Or follow-up with your primary care doctor.

## 2020-02-28 NOTE — ED Notes (Signed)
ED Provider at bedside. 

## 2020-02-28 NOTE — ED Provider Notes (Signed)
[facility] EMERGENCY DEPARTMENT Provider Note   CSN: [id number] Arrival date & time: 02/28/20  0820     History Chief Complaint  Patient presents with  . Sore Throat    Elaine Chase is a 44 y.o. female.  Patient developed a lot of throat irritation with some burning up into both ear area through the eustachian tube following cleaning pollen off the car yesterday.  Patient also has a little bit of drainage.  No eye itching.  No fever chills cough.  Patient stated it burns when she swallows.        Past Medical History:  Diagnosis Date  . Hidradenitis suppurativa     There are no problems to display for this patient.   Past Surgical History:  Procedure Laterality Date  . cellulitis     cellulitis debridement  . glandular cleanout    . pilonidal cyst removal       OB History   No obstetric history on file.     No family history on file.  Social History   Tobacco Use  . Smoking status: Former Smoker    Types: Cigarettes  . Smokeless tobacco: Never Used  Substance Use Topics  . Alcohol use: Yes    Comment: occasional  . Drug use: Yes    Types: Marijuana    Comment: yesterday    Home Medications Prior to Admission medications   Medication Sig Start Date End Date Taking? Authorizing Provider  cetirizine (ZYRTEC ALLERGY) 10 MG tablet Take 1 tablet (10 mg total) by mouth daily. 02/28/20   Vanetta Mulders, MD    Allergies    Clindamycin/lincomycin, Darvocet [propoxyphene n-acetaminophen], and Percocet [oxycodone-acetaminophen]  Review of Systems   Review of Systems  Constitutional: Negative for chills and fever.  HENT: Positive for congestion, sore throat and trouble swallowing. Negative for ear pain, facial swelling and rhinorrhea.   Eyes: Negative for visual disturbance.  Respiratory: Negative for cough and shortness of breath.   Cardiovascular: Negative for chest pain and leg swelling.  Gastrointestinal: Negative for abdominal pain,  diarrhea, nausea and vomiting.  Genitourinary: Negative for dysuria.  Musculoskeletal: Negative for back pain and neck pain.  Skin: Negative for rash.  Neurological: Negative for dizziness, light-headedness and headaches.  Hematological: Does not bruise/bleed easily.  Psychiatric/Behavioral: Negative for confusion.    Physical Exam Updated Vital Signs BP 130/85 (BP Location: Right Arm)   Pulse 82   Temp 98.9 F (37.2 C) (Oral)   Resp 16   Ht 1.803 m (5\' 11" )   Wt 68.9 kg   LMP 02/27/2020   SpO2 99%   BMI 21.20 kg/m   Physical Exam Vitals and nursing note reviewed.  Constitutional:      General: She is not in acute distress.    Appearance: She is well-developed.  HENT:     Head: Normocephalic and atraumatic.     Right Ear: Tympanic membrane and ear canal normal.     Left Ear: Tympanic membrane and ear canal normal.     Mouth/Throat:     Mouth: Mucous membranes are moist.     Pharynx: Oropharynx is clear. No oropharyngeal exudate or posterior oropharyngeal erythema.     Comments: Pharynx without any erythema no swelling uvula midline.  No exudate. Eyes:     Extraocular Movements: Extraocular movements intact.     Conjunctiva/sclera: Conjunctivae normal.     Pupils: Pupils are equal, round, and reactive to light.  Cardiovascular:     Rate and Rhythm:  Normal rate and regular rhythm.     Heart sounds: No murmur.  Pulmonary:     Effort: Pulmonary effort is normal. No respiratory distress.     Breath sounds: Normal breath sounds.  Abdominal:     Palpations: Abdomen is soft.     Tenderness: There is no abdominal tenderness.  Musculoskeletal:        General: Normal range of motion.     Cervical back: Neck supple.  Skin:    General: Skin is warm and dry.  Neurological:     General: No focal deficit present.     Mental Status: She is alert and oriented to person, place, and time.     ED Results / Procedures / Treatments   Labs (all labs ordered are listed, but only  abnormal results are displayed) Labs Reviewed - No data to display  EKG None  Radiology No results found.  Procedures Procedures (including critical care time)  Medications Ordered in ED Medications - No data to display  ED Course  I have reviewed the triage vital signs and the nursing notes.  Pertinent labs & imaging results that were available during my care of the patient were reviewed by me and considered in my medical decision making (see chart for details).    MDM Rules/Calculators/A&P                      Symptoms seem to be suggestive of seasonal allergy irritation.  Patient able to swallow water here fine.  Posterior pharynx very normal in appearance.  Do not feel that this is strep pharyngitis or an upper respiratory pharyngitis.  Patient has some congestion has burning sensation in the back of her throat that got it goes up to both ears.  TMs are normal bilaterally.  Feel that this is best treated with Zyrtec and some Motrin.  Patient does have primary care follow-up.  Patient nontoxic no acute distress.  Final Clinical Impression(s) / ED Diagnoses Final diagnoses:  Sore throat  Seasonal allergies    Rx / DC Orders ED Discharge Orders         Ordered    cetirizine (ZYRTEC ALLERGY) 10 MG tablet  Daily     02/28/20 0847           Fredia Sorrow, MD 02/28/20 [phone]

## 2020-02-28 NOTE — ED Notes (Signed)
Able to drink water without difficulty and after half o the cup of water was consumed, patient stated that her throat started to burn and she stated that her ears started burning.

## 2020-03-26 ENCOUNTER — Other Ambulatory Visit: Payer: Self-pay

## 2020-03-26 ENCOUNTER — Encounter (HOSPITAL_BASED_OUTPATIENT_CLINIC_OR_DEPARTMENT_OTHER): Payer: Self-pay | Admitting: Emergency Medicine

## 2020-03-26 ENCOUNTER — Emergency Department (HOSPITAL_BASED_OUTPATIENT_CLINIC_OR_DEPARTMENT_OTHER)
Admission: EM | Admit: 2020-03-26 | Discharge: 2020-03-26 | Disposition: A | Discharge status: Home / Self Care | Payer: Medicaid Other | Attending: Emergency Medicine | Admitting: Emergency Medicine

## 2020-03-26 ENCOUNTER — Emergency Department (HOSPITAL_BASED_OUTPATIENT_CLINIC_OR_DEPARTMENT_OTHER): Payer: Medicaid Other

## 2020-03-26 DIAGNOSIS — Z20822 Contact with and (suspected) exposure to covid-19: Secondary | ICD-10-CM | POA: Diagnosis not present

## 2020-03-26 DIAGNOSIS — Z79899 Other long term (current) drug therapy: Secondary | ICD-10-CM | POA: Diagnosis not present

## 2020-03-26 DIAGNOSIS — J069 Acute upper respiratory infection, unspecified: Secondary | ICD-10-CM | POA: Diagnosis not present

## 2020-03-26 DIAGNOSIS — Z87891 Personal history of nicotine dependence: Secondary | ICD-10-CM | POA: Insufficient documentation

## 2020-03-26 DIAGNOSIS — R05 Cough: Secondary | ICD-10-CM | POA: Diagnosis present

## 2020-03-26 LAB — SARS CORONAVIRUS 2 (TAT 6-24 HRS): SARS Coronavirus 2: NEGATIVE

## 2020-03-26 MED ORDER — ALBUTEROL SULFATE HFA 108 (90 BASE) MCG/ACT IN AERS
2.0000 | INHALATION_SPRAY | Freq: Once | RESPIRATORY_TRACT | Status: AC
  Administered 2020-03-26: 10:00:00 2 via RESPIRATORY_TRACT
  Filled 2020-03-26: qty 6.7

## 2020-03-26 NOTE — Discharge Instructions (Addendum)
You were seen in the emergency department for cough and nasal congestion.  Your chest x-ray did not show any obvious pneumonia.  Your symptoms improve you using an albuterol inhaler.  You can continue the inhaler 2 puffs every 4-6 hours as needed.  Please drink plenty of fluids.  Please continue to use your allergy medication.  Follow-up with your doctor and return if any worsening symptoms.  You should isolate until your Covid test is resulted.  If you are positive you will need to isolate for up to 2 weeks.

## 2020-03-26 NOTE — ED Triage Notes (Signed)
Pt c/o cough and nasal congestion x 3 days. Pt denies fever, sore throat

## 2020-03-26 NOTE — ED Provider Notes (Signed)
[facility] EMERGENCY DEPARTMENT Provider Note   CSN: [id number] Arrival date & time: 03/26/20  2355     History Chief Complaint  Patient presents with  . Cough    Elaine Chase is a 44 y.o. female.  She has history of seasonal allergies.  Complaining of 3 days of cough productive of some white sputum along with nasal congestion.  No fever but felt chilled yesterday.  Using cetirizine with some improvement.  No chest pain or shortness of breath.  The history is provided by the patient.  Cough Cough characteristics:  Productive Sputum characteristics:  White Severity:  Moderate Onset quality:  Gradual Timing:  Intermittent Progression:  Unchanged Chronicity:  New Smoker: no   Context: exposure to allergens   Relieved by:  Nothing Worsened by:  Nothing Ineffective treatments:  None tried Associated symptoms: chills, rhinorrhea, sore throat and wheezing   Associated symptoms: no chest pain, no ear pain, no eye discharge, no fever, no headaches, no myalgias, no rash and no shortness of breath        Past Medical History:  Diagnosis Date  . Hidradenitis suppurativa     There are no problems to display for this patient.   Past Surgical History:  Procedure Laterality Date  . cellulitis     cellulitis debridement  . glandular cleanout    . pilonidal cyst removal       OB History   No obstetric history on file.     History reviewed. No pertinent family history.  Social History   Tobacco Use  . Smoking status: Former Smoker    Types: Cigarettes  . Smokeless tobacco: Never Used  Substance Use Topics  . Alcohol use: Yes    Comment: occasional  . Drug use: Yes    Types: Marijuana    Comment: yesterday    Home Medications Prior to Admission medications   Medication Sig Start Date End Date Taking? Authorizing Provider  cetirizine (ZYRTEC ALLERGY) 10 MG tablet Take 1 tablet (10 mg total) by mouth daily. 02/28/20   Fredia Sorrow, MD     Allergies    Clindamycin/lincomycin, Darvocet [propoxyphene n-acetaminophen], and Percocet [oxycodone-acetaminophen]  Review of Systems   Review of Systems  Constitutional: Positive for chills. Negative for fever.  HENT: Positive for rhinorrhea and sore throat. Negative for ear pain.   Eyes: Negative for discharge.  Respiratory: Positive for cough and wheezing. Negative for shortness of breath.   Cardiovascular: Negative for chest pain.  Gastrointestinal: Negative for abdominal pain.  Genitourinary: Negative for dysuria.  Musculoskeletal: Negative for myalgias.  Skin: Negative for rash.  Neurological: Negative for headaches.    Physical Exam Updated Vital Signs BP 123/72 (BP Location: Right Arm)   Pulse 83   Temp 98.3 F (36.8 C) (Oral)   Resp 16   Ht 5\' 11"  (1.803 m)   Wt 75.8 kg   LMP 02/27/2020 (Approximate)   SpO2 100%   BMI 23.29 kg/m   Physical Exam Vitals and nursing note reviewed.  Constitutional:      General: She is not in acute distress.    Appearance: She is well-developed.  HENT:     Head: Normocephalic and atraumatic.     Right Ear: Tympanic membrane normal.     Left Ear: Tympanic membrane normal.     Nose: Congestion present.     Mouth/Throat:     Mouth: Mucous membranes are moist.     Pharynx: Oropharynx is clear.  Eyes:  Conjunctiva/sclera: Conjunctivae normal.  Cardiovascular:     Rate and Rhythm: Normal rate and regular rhythm.     Heart sounds: No murmur.  Pulmonary:     Effort: Pulmonary effort is normal. No respiratory distress.     Breath sounds: Normal breath sounds. No stridor. No wheezing.  Abdominal:     Palpations: Abdomen is soft.     Tenderness: There is no abdominal tenderness.  Musculoskeletal:        General: No tenderness. Normal range of motion.     Cervical back: Neck supple.  Skin:    General: Skin is warm and dry.     Capillary Refill: Capillary refill takes less than 2 seconds.  Neurological:     General: No  focal deficit present.     Mental Status: She is alert.     GCS: GCS eye subscore is 4. GCS verbal subscore is 5. GCS motor subscore is 6.     ED Results / Procedures / Treatments   Labs (all labs ordered are listed, but only abnormal results are displayed) Labs Reviewed  SARS CORONAVIRUS 2 (TAT 6-24 HRS)    EKG None  Radiology DG Chest Port 1 View  Result Date: 03/26/2020 CLINICAL DATA:  Cough for 3 days EXAM: PORTABLE CHEST 1 VIEW COMPARISON:  08/26/2013 FINDINGS: The heart size and mediastinal contours are within normal limits. Minimal atherosclerotic calcification of the aortic knob. No focal airspace consolidation, pleural effusion, or pneumothorax. The visualized skeletal structures are unremarkable. IMPRESSION: No active disease. Electronically Signed   By: Duanne Guess D.O.   On: 03/26/2020 09:51    Procedures Procedures (including critical care time)  Medications Ordered in ED Medications  albuterol (VENTOLIN HFA) 108 (90 Base) MCG/ACT inhaler 2 puff (has no administration in time range)    ED Course  I have reviewed the triage vital signs and the nursing notes.  Pertinent labs & imaging results that were available during my care of the patient were reviewed by me and considered in my medical decision making (see chart for details).  Clinical Course as of Mar 26 1749  Tue Mar 26, 2020  9935 Chest x-ray interpreted by me as no gross infiltrates.   [MB]  1002 Patient feels better after inhalation treatment.  Will send home with inhaler.  We discussed her isolating until her Covid test is resulted either later today or tomorrow.  If positive she will need to isolate for 2 weeks.   [MB]    Clinical Course User Index [MB] Terrilee Files, MD   MDM Rules/Calculators/A&P                     Patriece Archbold was evaluated in Emergency Department on 03/26/2020 for the symptoms described in the history of present illness. She was evaluated in the context of the global  COVID-19 pandemic, which necessitated consideration that the patient might be at risk for infection with the SARS-CoV-2 virus that causes COVID-19. Institutional protocols and algorithms that pertain to the evaluation of patients at risk for COVID-19 are in a state of rapid change based on information released by regulatory bodies including the CDC and federal and state organizations. These policies and algorithms were followed during the patient's care in the ED.  Differential includes seasonal allergies, reactive airway disease, asthma, pneumonia, bronchitis, Covid  Final Clinical Impression(s) / ED Diagnoses Final diagnoses:  Viral URI with cough  Person under investigation for COVID-19    Rx / DC Orders  ED Discharge Orders    None       Terrilee Files, MD 03/26/20 1750

## 2020-04-02 ENCOUNTER — Other Ambulatory Visit: Payer: Self-pay

## 2020-04-02 ENCOUNTER — Emergency Department (HOSPITAL_BASED_OUTPATIENT_CLINIC_OR_DEPARTMENT_OTHER)
Admission: EM | Admit: 2020-04-02 | Discharge: 2020-04-02 | Disposition: A | Discharge status: Home / Self Care | Payer: Medicaid Other | Attending: Emergency Medicine | Admitting: Emergency Medicine

## 2020-04-02 ENCOUNTER — Encounter (HOSPITAL_BASED_OUTPATIENT_CLINIC_OR_DEPARTMENT_OTHER): Payer: Self-pay | Admitting: *Deleted

## 2020-04-02 DIAGNOSIS — Z79899 Other long term (current) drug therapy: Secondary | ICD-10-CM | POA: Insufficient documentation

## 2020-04-02 DIAGNOSIS — Z20828 Contact with and (suspected) exposure to other viral communicable diseases: Secondary | ICD-10-CM

## 2020-04-02 DIAGNOSIS — Z881 Allergy status to other antibiotic agents status: Secondary | ICD-10-CM | POA: Insufficient documentation

## 2020-04-02 DIAGNOSIS — Z2089 Contact with and (suspected) exposure to other communicable diseases: Secondary | ICD-10-CM | POA: Insufficient documentation

## 2020-04-02 DIAGNOSIS — J302 Other seasonal allergic rhinitis: Secondary | ICD-10-CM

## 2020-04-02 DIAGNOSIS — Z885 Allergy status to narcotic agent status: Secondary | ICD-10-CM | POA: Diagnosis not present

## 2020-04-02 DIAGNOSIS — Z87891 Personal history of nicotine dependence: Secondary | ICD-10-CM | POA: Diagnosis not present

## 2020-04-02 DIAGNOSIS — J301 Allergic rhinitis due to pollen: Secondary | ICD-10-CM | POA: Insufficient documentation

## 2020-04-02 DIAGNOSIS — R05 Cough: Secondary | ICD-10-CM | POA: Diagnosis present

## 2020-04-02 LAB — MONONUCLEOSIS SCREEN: Mono Screen: NEGATIVE

## 2020-04-02 NOTE — Discharge Instructions (Signed)
PLEASE CHECK MYCHART LATER TODAY FOR YOUR MONO RESULTS. IF NEGATIVE, YOU ARE LIKELY EXPERIENCING SYMPTOMS RELATED TO SEASONAL ALLERGIES. IF POSITIVE, AVOID SHARING DRINKS WITH OTHERS AND NO CONTACT SPORTS/DANGEROUS ACTIVITIES FOR 6 WEEKS.

## 2020-04-02 NOTE — ED Provider Notes (Signed)
[facility] EMERGENCY DEPARTMENT Provider Note   CSN: [id number] Arrival date & time: 04/02/20  0731     History Chief Complaint  Patient presents with  . Cough    Elaine Chase is a 44 y.o. female.  44 year old female with past medical history below who presents with cough and scratchy throat.  Patient states that she has been having intermittent symptoms of cough and sore throat associated with runny nose and sneezing for the past 5 weeks.  She initially thought that symptoms were due to seasonal allergies and she has been taking over-the-counter medications. She was seen here on 5/4 for symptoms and tested negative for COVID-19. However, her son recently tested positive for mono and she presents requesting mono testing. She reports fatigue and sleeping a lot. No fevers or vomiting.  The history is provided by the patient.  Cough      Past Medical History:  Diagnosis Date  . Hidradenitis suppurativa     There are no problems to display for this patient.   Past Surgical History:  Procedure Laterality Date  . cellulitis     cellulitis debridement  . glandular cleanout    . pilonidal cyst removal       OB History   No obstetric history on file.     History reviewed. No pertinent family history.  Social History   Tobacco Use  . Smoking status: Former Smoker    Types: Cigarettes  . Smokeless tobacco: Never Used  Substance Use Topics  . Alcohol use: Yes    Comment: occasional  . Drug use: Yes    Types: Marijuana    Comment: yesterday    Home Medications Prior to Admission medications   Medication Sig Start Date End Date Taking? Authorizing Provider  cetirizine (ZYRTEC ALLERGY) 10 MG tablet Take 1 tablet (10 mg total) by mouth daily. 02/28/20   Vanetta Mulders, MD    Allergies    Clindamycin/lincomycin, Darvocet [propoxyphene n-acetaminophen], and Percocet [oxycodone-acetaminophen]  Review of Systems   Review of Systems  Respiratory:  Positive for cough.    All other systems reviewed and are negative except that which was mentioned in HPI  Physical Exam Updated Vital Signs BP 128/86 (BP Location: Right Arm)   Pulse 86   Temp 98.1 F (36.7 C) (Oral)   Resp 16   Ht 5\' 11"  (1.803 m)   Wt 65.8 kg   LMP 03/25/2020   SpO2 98%   BMI 20.22 kg/m   Physical Exam Vitals and nursing note reviewed.  Constitutional:      General: She is not in acute distress.    Appearance: She is well-developed.  HENT:     Head: Normocephalic and atraumatic.  Eyes:     Conjunctiva/sclera: Conjunctivae normal.  Cardiovascular:     Rate and Rhythm: Normal rate and regular rhythm.     Heart sounds: Normal heart sounds. No murmur.  Pulmonary:     Effort: Pulmonary effort is normal.     Breath sounds: Normal breath sounds.  Abdominal:     General: Bowel sounds are normal. There is no distension.     Palpations: Abdomen is soft.     Tenderness: There is no abdominal tenderness.     Comments: No splenomegaly  Musculoskeletal:     Cervical back: Neck supple.     Right lower leg: No edema.     Left lower leg: No edema.  Skin:    General: Skin is warm and dry.  Neurological:  Mental Status: She is alert and oriented to person, place, and time.     Comments: Fluent speech  Psychiatric:        Judgment: Judgment normal.     ED Results / Procedures / Treatments   Labs (all labs ordered are listed, but only abnormal results are displayed) Labs Reviewed  MONONUCLEOSIS SCREEN    EKG None  Radiology No results found.  Procedures Procedures (including critical care time)  Medications Ordered in ED Medications - No data to display  ED Course  I have reviewed the triage vital signs and the nursing notes.     MDM Rules/Calculators/A&P                       Given intermittent sx for 5 weeks and the nature of symptoms, I strongly suspect seasonal allergies rather than acute viral process, however offered mono testing  given exposure. Discussed what to do regarding results--if positive, avoidance of sharing drinks w/ others and no contact sports for 6 weeks. She voiced understanding. Final Clinical Impression(s) / ED Diagnoses Final diagnoses:  Seasonal allergies  Mono exposure    Rx / DC Orders ED Discharge Orders    None       Jyl Chico, Wenda Overland, MD 04/02/20 [phone]

## 2020-04-02 NOTE — ED Triage Notes (Signed)
Pt reports 5 weeks of intermittent cough and sore throat, thought it was her allergies, but her son tested + for mono, so she wonders if she also has mono

## 2020-10-25 IMAGING — CT CT PELVIS W/ CM
2 of 3 series · 16 of 46 positions shown, 18 images · IV contrast (ISOVUE)
Comparison: None

CLINICAL DATA: Disease of Bartholin gland, RIGHT labial abscess
with tenderness

EXAM:
CT PELVIS WITH CONTRAST
TECHNIQUE: Multidetector CT imaging of the pelvis was performed using the
standard protocol following the bolus administration of intravenous
contrast. Sagittal and coronal MPR images reconstructed from axial
data set.
CONTRAST:  100mL YIPM70-XLL IOPAMIDOL (YIPM70-XLL) INJECTION 61% IV.
No oral contrast.

[Series 2: axial st · axial · 0.67mm/px · z∈[-301,-71]mm · 13 of 54 slices shown, 15 images]
[im 4/54  soft-tissue]
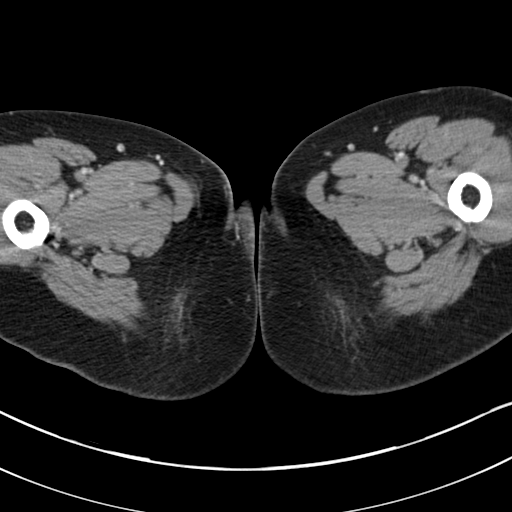
[im 4/54  bone]
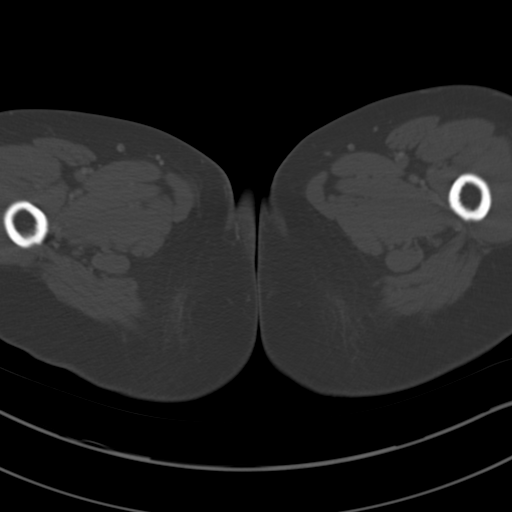
[im 7/54  soft-tissue]
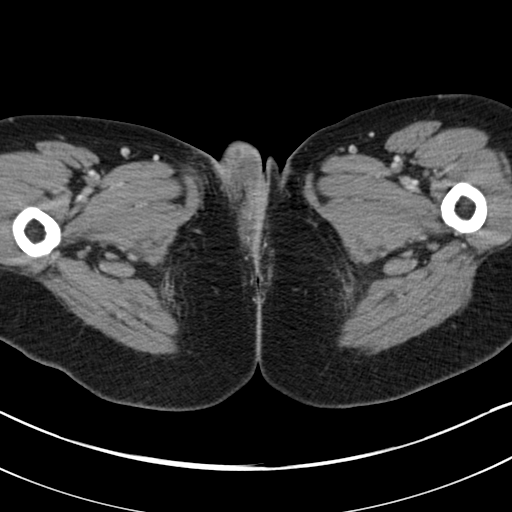
[im 11/54  soft-tissue]
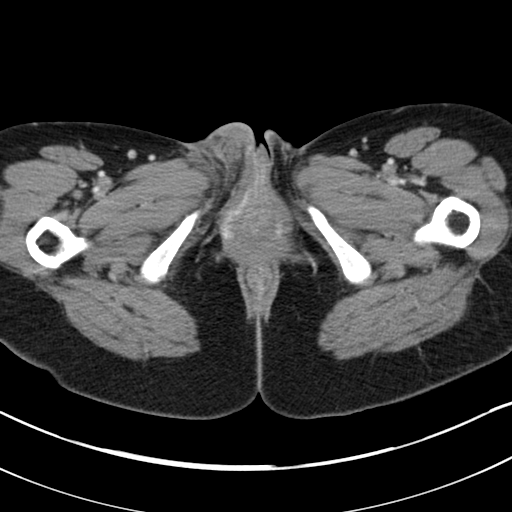
[im 16/54  soft-tissue]
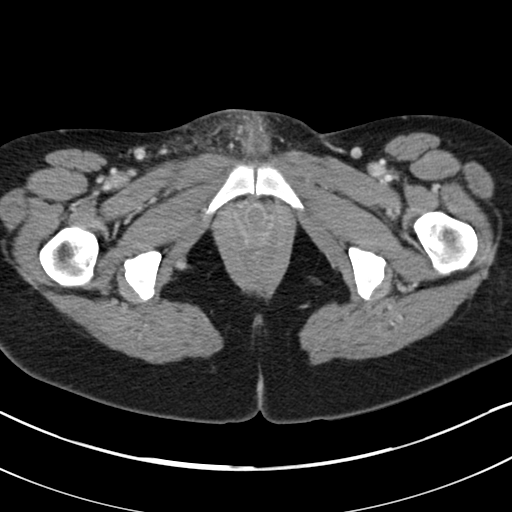
[im 19/54  soft-tissue]
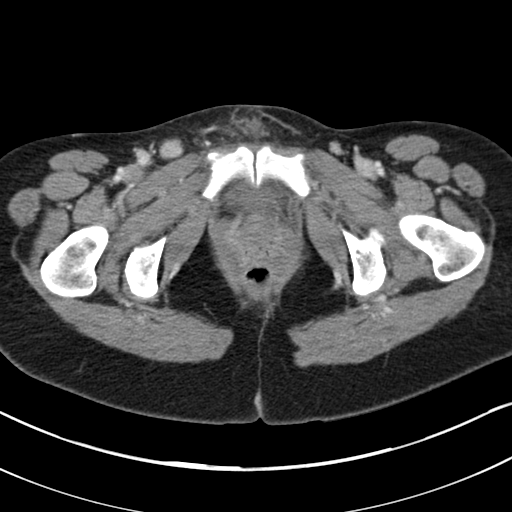
[im 23/54  soft-tissue]
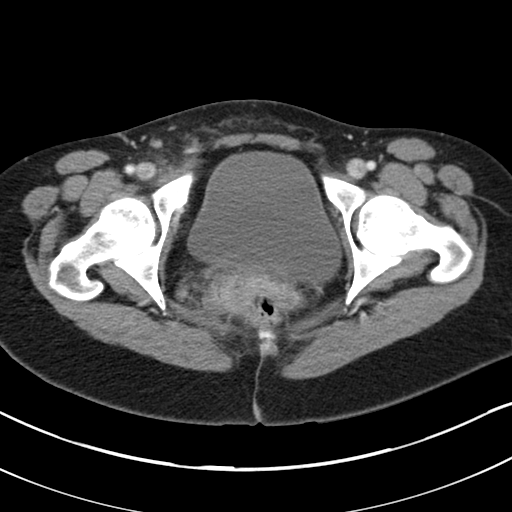
[im 28/54  soft-tissue]
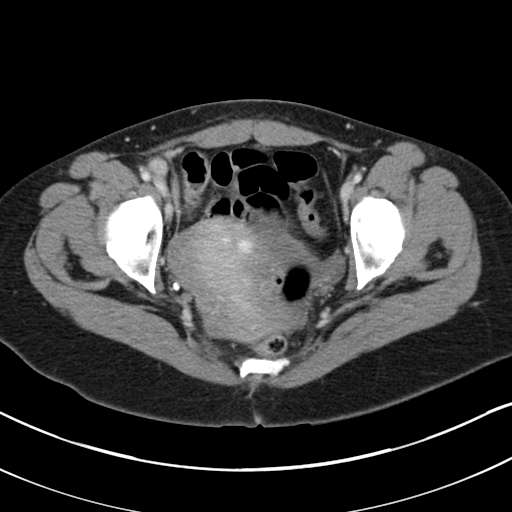
[im 31/54  soft-tissue]
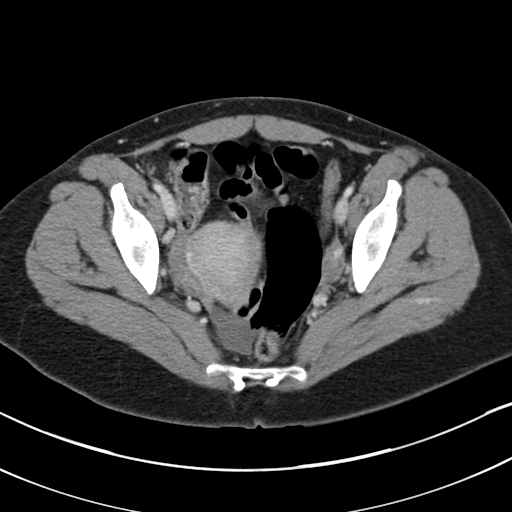
[im 35/54  soft-tissue]
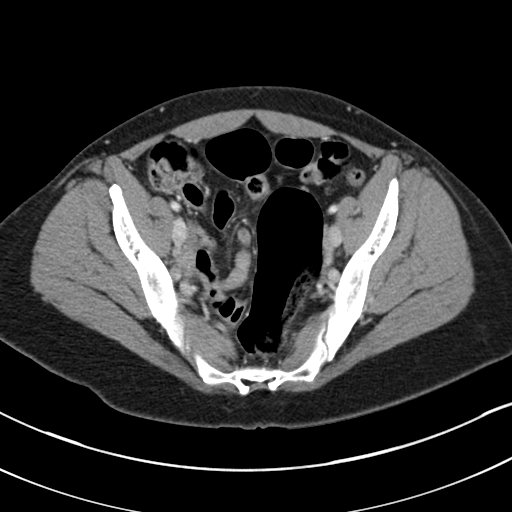
[im 35/54  bone]
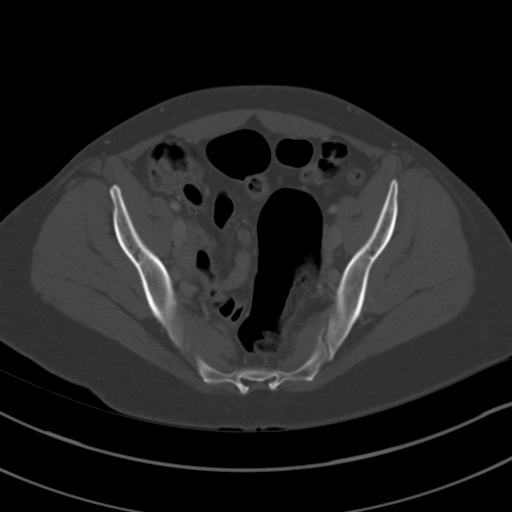
[im 38/54  soft-tissue]
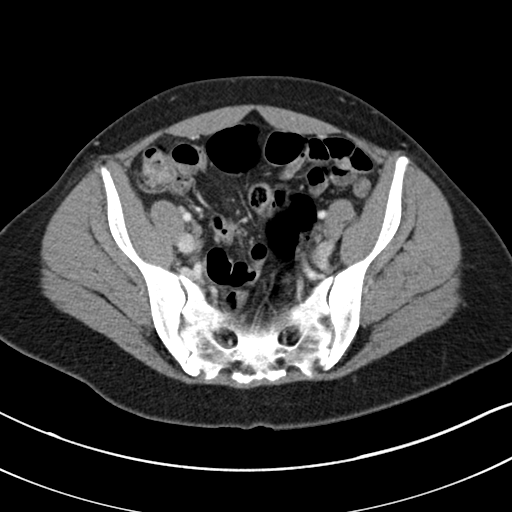
[im 43/54  soft-tissue]
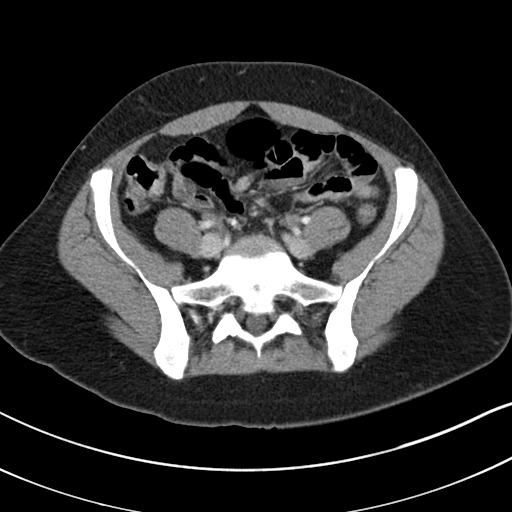
[im 47/54  soft-tissue]
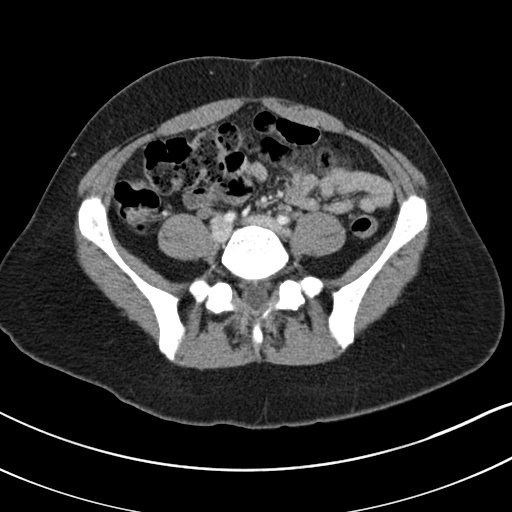
[im 50/54  soft-tissue]
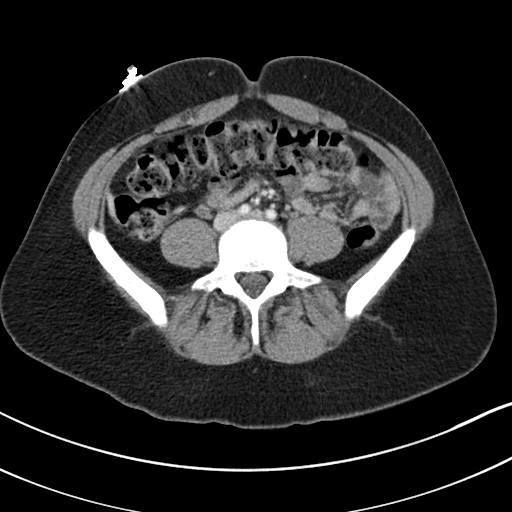

[Series 5: coronal st · coronal · 0.54mm/px · 3 of 83 slices shown]
[im 28/83  soft-tissue]
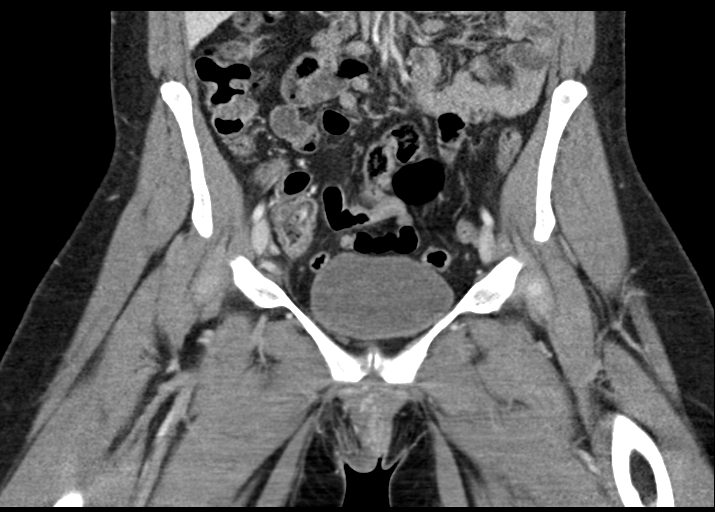
[im 37/83  soft-tissue]
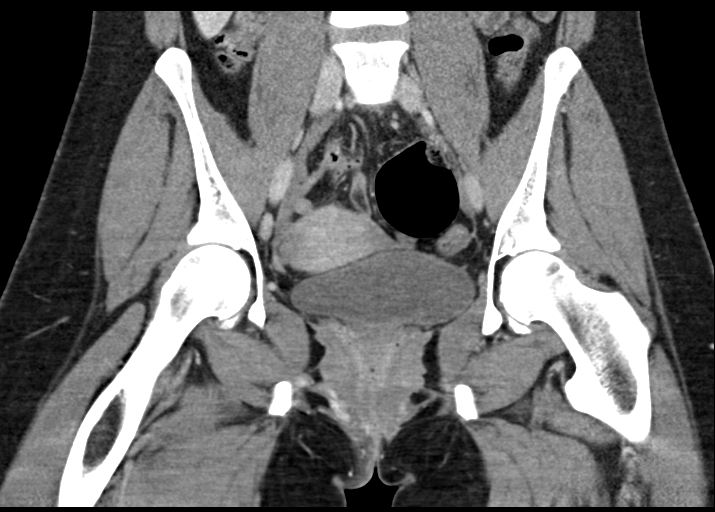
[im 46/83  soft-tissue]
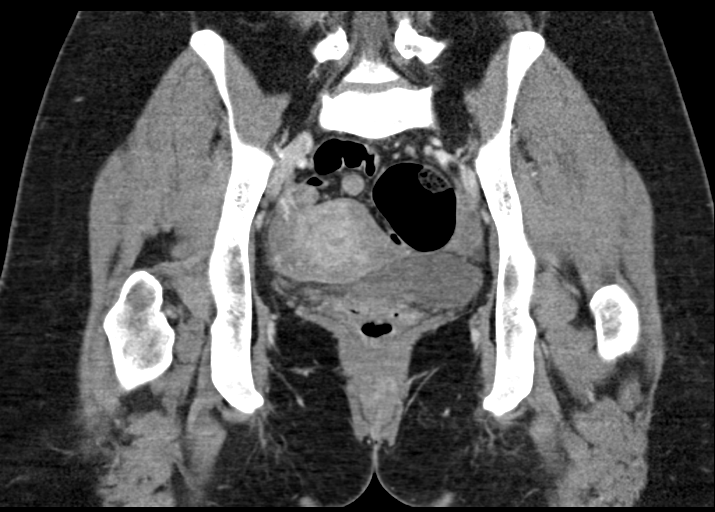

[16 of 46 positions shown; findings below may reference images not displayed]

FINDINGS: Urinary Tract:  Distal ureters and bladder normal appearance

Bowel: Normal appendix. Visualized large and small bowel loops
unremarkable.

Vascular/Lymphatic: Vascular structures patent and unremarkable.
Enlarged RIGHT external iliac lymph node 22 mm short axis.
Additional small RIGHT inguinal node 11 mm short axis and RIGHT
external iliac node 13 mm short axis.

Reproductive: Unremarkable ovaries. Two small enhancing nodules
within the LEFT lateral aspect of the uterus question tiny
leiomyomata.

Other: Small amount of free pelvic fluid. Loculated bilobed fluid
collection identified within the RIGHT labia majora 2.2 x 1.8 x
cm in size with surrounding hyperemia, soft tissue thickening and
skin thickening consistent with an abscess. Tiny umbilical hernia
containing fat.

Musculoskeletal: Unremarkable
IMPRESSION: Small subcutaneous bilobed abscess at the RIGHT labia majora 2.2 x
2.0 x 1.8 cm with surrounding inflammatory changes.

Enlarged RIGHT external iliac lymph node likely reactive.

## 2020-11-27 ENCOUNTER — Other Ambulatory Visit: Payer: Self-pay | Admitting: *Deleted

## 2020-11-27 ENCOUNTER — Other Ambulatory Visit: Payer: Self-pay | Admitting: Physician Assistant

## 2020-11-27 ENCOUNTER — Other Ambulatory Visit: Payer: Self-pay | Admitting: Nurse Practitioner

## 2020-11-27 DIAGNOSIS — R5381 Other malaise: Secondary | ICD-10-CM

## 2020-11-27 DIAGNOSIS — N6310 Unspecified lump in the right breast, unspecified quadrant: Secondary | ICD-10-CM

## 2021-01-02 ENCOUNTER — Other Ambulatory Visit: Payer: Medicaid Other

## 2021-02-14 ENCOUNTER — Other Ambulatory Visit: Payer: Self-pay

## 2022-01-31 IMAGING — DX DG CHEST 1V PORT
1 series · 1 of 1 positions shown · non-contrast
Comparison: 08/26/2013

CLINICAL DATA: Cough for 3 days

EXAM:
PORTABLE CHEST 1 VIEW

[chest ap]
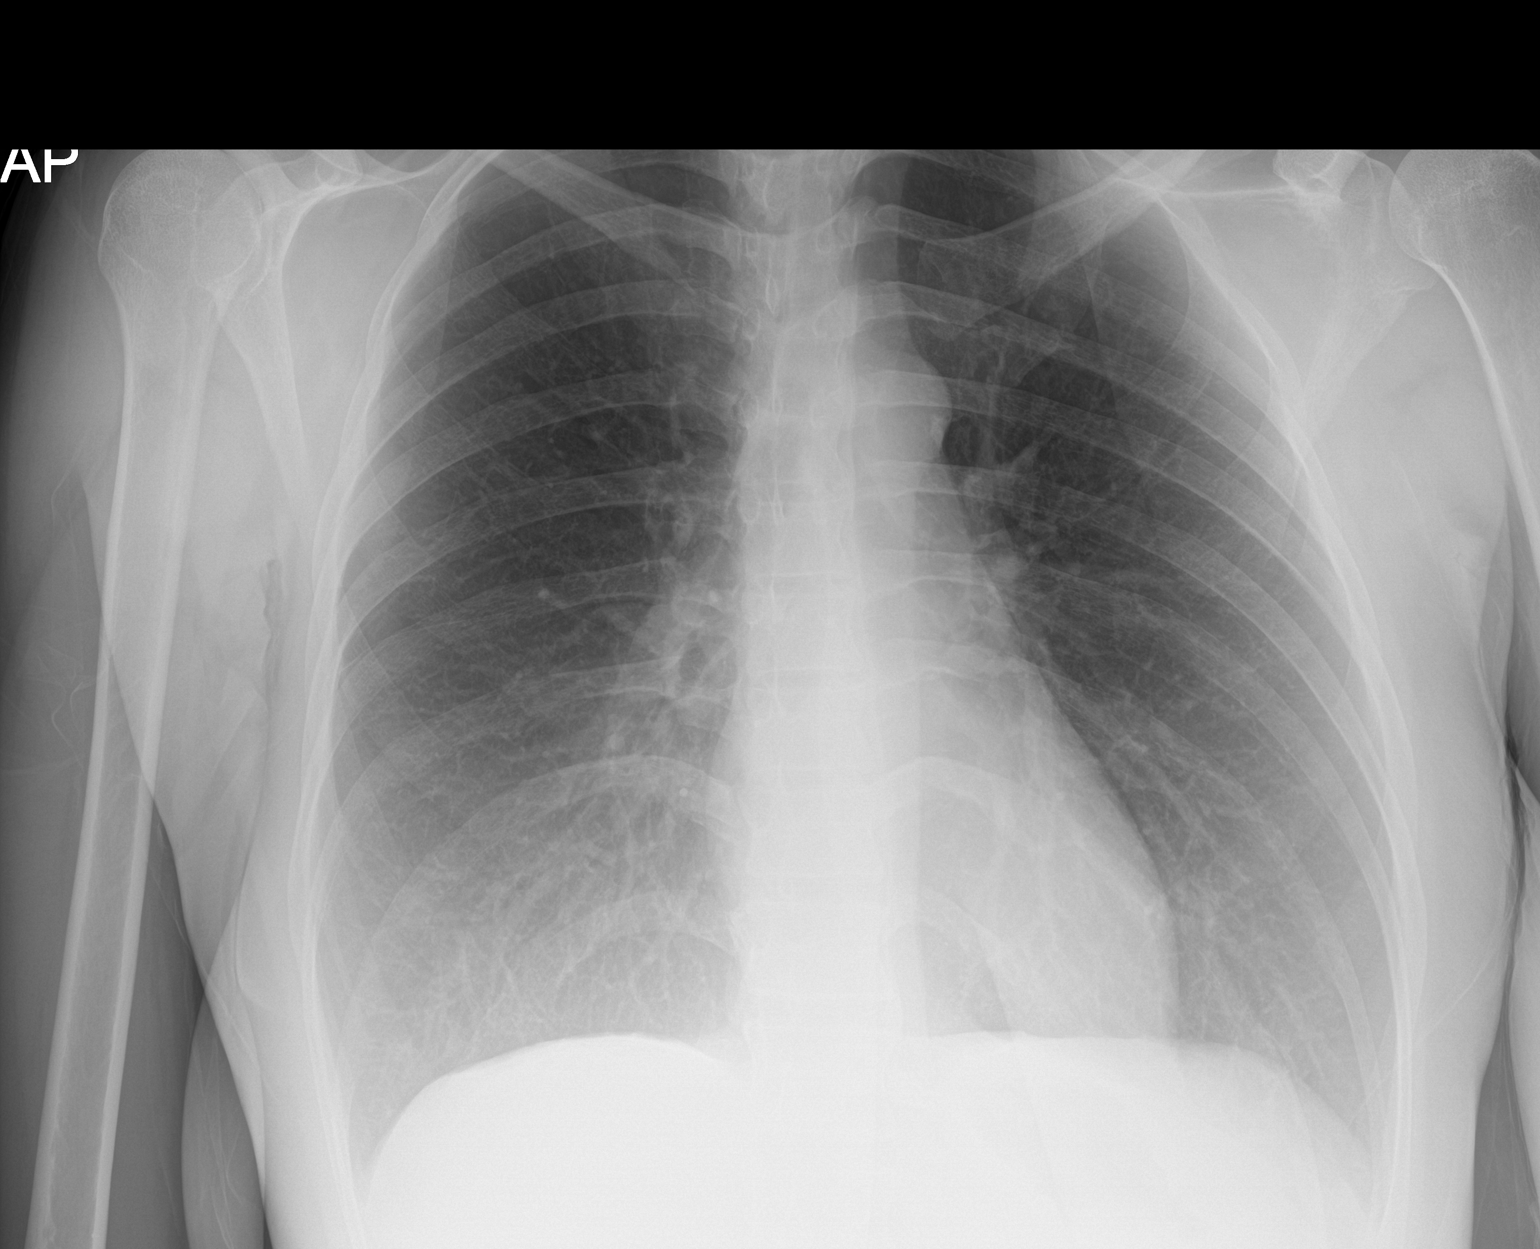

[1 of 1 positions shown; findings below may reference images not displayed]

FINDINGS: The heart size and mediastinal contours are within normal limits.
Minimal atherosclerotic calcification of the aortic knob. No focal
airspace consolidation, pleural effusion, or pneumothorax. The
visualized skeletal structures are unremarkable.
IMPRESSION: No active disease.
